# Patient Record
Sex: Male | Born: 1970 | Race: Black or African American | Hispanic: No | Marital: Married | Smoking: Never smoker
Health system: Southern US, Community
[De-identification: ages and names within clinical notes are randomized; demographics above are authoritative.]

## PROBLEM LIST (undated history)

## (undated) DIAGNOSIS — T7840XA Allergy, unspecified, initial encounter: Secondary | ICD-10-CM

## (undated) HISTORY — DX: Allergy, unspecified, initial encounter: T78.40XA

## (undated) HISTORY — PX: BACK SURGERY: SHX140

---

## 2004-10-31 ENCOUNTER — Emergency Department (HOSPITAL_COMMUNITY): Admission: EM | Admit: 2004-10-31 | Discharge: 2004-10-31 | Payer: Self-pay | Admitting: Family Medicine

## 2016-11-10 ENCOUNTER — Ambulatory Visit
Admission: EM | Admit: 2016-11-10 | Discharge: 2016-11-10 | Disposition: A | Discharge status: Home / Self Care | Payer: BC Managed Care – PPO | Attending: Family Medicine | Admitting: Family Medicine

## 2016-11-10 ENCOUNTER — Encounter: Payer: Self-pay | Admitting: *Deleted

## 2016-11-10 DIAGNOSIS — B9789 Other viral agents as the cause of diseases classified elsewhere: Secondary | ICD-10-CM

## 2016-11-10 DIAGNOSIS — J069 Acute upper respiratory infection, unspecified: Secondary | ICD-10-CM

## 2016-11-10 NOTE — ED Triage Notes (Signed)
Patient started having symptoms of body aches, fever, and nasal congestion 3 days ago.

## 2016-11-10 NOTE — ED Provider Notes (Signed)
[facility]    CSN: [id number] Arrival date & time: 11/10/16  1001     History   Chief Complaint Chief Complaint  Patient presents with  . Generalized Body Aches  . Fever  . Nasal Congestion    HPI Darren Rollins is a 46 y.o. male.    Fever  Associated symptoms: congestion, cough, myalgias and rhinorrhea   URI  Presenting symptoms: congestion, cough, fever and rhinorrhea   Severity:  Moderate Onset quality:  Sudden Duration:  3 days Timing:  Constant Progression:  Unchanged Chronicity:  New Relieved by:  None tried Associated symptoms: myalgias   Risk factors: not elderly, no chronic cardiac disease, no chronic kidney disease, no chronic respiratory disease, no diabetes mellitus, no immunosuppression, no recent illness, no recent travel and no sick contacts     History reviewed. No pertinent past medical history.  There are no active problems to display for this patient.   Past Surgical History:  Procedure Laterality Date  . BACK SURGERY         Home Medications    Prior to Admission medications   Not on File    Family History History reviewed. No pertinent family history.  Social History Social History  Substance Use Topics  . Smoking status: Never Smoker  . Smokeless tobacco: Never Used  . Alcohol use No     Allergies   Penicillins   Review of Systems Review of Systems  Constitutional: Positive for fever.  HENT: Positive for congestion and rhinorrhea.   Respiratory: Positive for cough.   Musculoskeletal: Positive for myalgias.     Physical Exam Triage Vital Signs ED Triage Vitals  Enc Vitals Group     BP 11/10/16 1050 129/69     Pulse Rate 11/10/16 1050 79     Resp 11/10/16 1050 16     Temp 11/10/16 1050 98.2 F (36.8 C)     Temp Source 11/10/16 1050 Oral     SpO2 11/10/16 1050 100 %     Weight 11/10/16 1051 192 lb (87.1 kg)     Height 11/10/16 1051 5\' 7"  (1.702 m)     Head Circumference --      Peak Flow --        Pain Score 11/10/16 1054 0     Pain Loc --      Pain Edu? --      Excl. in GC? --    No data found.   Updated Vital Signs BP 129/69 (BP Location: Left Arm)   Pulse 79   Temp 98.2 F (36.8 C) (Oral)   Resp 16   Ht 5\' 7"  (1.702 m)   Wt 192 lb (87.1 kg)   SpO2 100%   BMI 30.07 kg/m   Visual Acuity Right Eye Distance:   Left Eye Distance:   Bilateral Distance:    Right Eye Near:   Left Eye Near:    Bilateral Near:     Physical Exam  Constitutional: He appears well-developed and well-nourished. No distress.  HENT:  Head: Normocephalic and atraumatic.  Right Ear: Tympanic membrane, external ear and ear canal normal.  Left Ear: Tympanic membrane, external ear and ear canal normal.  Nose: Nose normal.  Mouth/Throat: Uvula is midline, oropharynx is clear and moist and mucous membranes are normal. No oropharyngeal exudate or tonsillar abscesses.  Eyes: Conjunctivae and EOM are normal. Pupils are equal, round, and reactive to light. Right eye exhibits no discharge. Left eye exhibits no discharge. No  scleral icterus.  Neck: Normal range of motion. Neck supple. No tracheal deviation present. No thyromegaly present.  Cardiovascular: Normal rate, regular rhythm and normal heart sounds.   Pulmonary/Chest: Effort normal and breath sounds normal. No stridor. No respiratory distress. He has no wheezes. He has no rales. He exhibits no tenderness.  Lymphadenopathy:    He has no cervical adenopathy.  Neurological: He is alert.  Skin: Skin is warm and dry. No rash noted. He is not diaphoretic.  Nursing note and vitals reviewed.    UC Treatments / Results  Labs (all labs ordered are listed, but only abnormal results are displayed) Labs Reviewed - No data to display  EKG  EKG Interpretation None       Radiology No results found.  Procedures Procedures (including critical care time)  Medications Ordered in UC Medications - No data to display   Initial Impression /  Assessment and Plan / UC Course  I have reviewed the triage vital signs and the nursing notes.  Pertinent labs & imaging results that were available during my care of the patient were reviewed by me and considered in my medical decision making (see chart for details).       Final Clinical Impressions(s) / UC Diagnoses   Final diagnoses:  Viral URI with cough    New Prescriptions There are no discharge medications for this patient.  1. diagnosis reviewed with patient 2. Recommend supportive treatment with rest, fluids, otc meds 3. Follow-up prn if symptoms worsen or don't improve   Payton Mccallumrlando Melora Menon, MD 11/10/16 1202

## 2016-11-19 ENCOUNTER — Ambulatory Visit (INDEPENDENT_AMBULATORY_CARE_PROVIDER_SITE_OTHER): Payer: BC Managed Care – PPO | Admitting: Family Medicine

## 2016-11-19 ENCOUNTER — Encounter: Payer: Self-pay | Admitting: Family Medicine

## 2016-11-19 VITALS — BP 118/70 | HR 90 | Temp 97.9°F | Resp 16 | Ht 67.5 in | Wt 193.0 lb

## 2016-11-19 DIAGNOSIS — Z23 Encounter for immunization: Secondary | ICD-10-CM | POA: Diagnosis not present

## 2016-11-19 DIAGNOSIS — Z0000 Encounter for general adult medical examination without abnormal findings: Secondary | ICD-10-CM | POA: Diagnosis not present

## 2016-11-19 DIAGNOSIS — E663 Overweight: Secondary | ICD-10-CM | POA: Diagnosis not present

## 2016-11-19 LAB — COMPREHENSIVE METABOLIC PANEL
ALT: 33 U/L (ref 9–46)
AST: 22 U/L (ref 10–40)
Albumin: 4.2 g/dL (ref 3.6–5.1)
Alkaline Phosphatase: 49 U/L (ref 40–115)
BILIRUBIN TOTAL: 0.4 mg/dL (ref 0.2–1.2)
BUN: 14 mg/dL (ref 7–25)
CALCIUM: 9 mg/dL (ref 8.6–10.3)
CO2: 26 mmol/L (ref 20–31)
Chloride: 104 mmol/L (ref 98–110)
Creat: 1.08 mg/dL (ref 0.60–1.35)
Glucose, Bld: 82 mg/dL (ref 70–99)
Potassium: 4.2 mmol/L (ref 3.5–5.3)
SODIUM: 138 mmol/L (ref 135–146)
Total Protein: 7.3 g/dL (ref 6.1–8.1)

## 2016-11-19 LAB — LIPID PANEL
CHOL/HDL RATIO: 4.6 ratio (ref <5.0)
Cholesterol: 161 mg/dL (ref <200)
HDL: 35 mg/dL — AB (ref >40)
LDL Cholesterol: 83 mg/dL (ref <100)
Triglycerides: 213 mg/dL — ABNORMAL HIGH (ref <150)
VLDL: 43 mg/dL — ABNORMAL HIGH (ref <30)

## 2016-11-19 LAB — CBC WITH DIFFERENTIAL/PLATELET
BASOS PCT: 2 %
Basophils Absolute: 94 cells/uL (ref 0–200)
EOS PCT: 2 %
Eosinophils Absolute: 94 cells/uL (ref 15–500)
HEMATOCRIT: 47.3 % (ref 38.5–50.0)
Hemoglobin: 15.9 g/dL (ref 13.0–17.0)
LYMPHS PCT: 46 %
Lymphs Abs: 2162 cells/uL (ref 850–3900)
MCH: 30.5 pg (ref 27.0–33.0)
MCHC: 33.6 g/dL (ref 32.0–36.0)
MCV: 90.6 fL (ref 80.0–100.0)
MPV: 10.6 fL (ref 7.5–12.5)
Monocytes Absolute: 423 cells/uL (ref 200–950)
Monocytes Relative: 9 %
NEUTROS PCT: 41 %
Neutro Abs: 1927 cells/uL (ref 1500–7800)
PLATELETS: 256 10*3/uL (ref 140–400)
RBC: 5.22 MIL/uL (ref 4.20–5.80)
RDW: 13.8 % (ref 11.0–15.0)
WBC: 4.7 10*3/uL (ref 3.8–10.8)

## 2016-11-19 LAB — TSH: TSH: 1.96 mIU/L (ref 0.40–4.50)

## 2016-11-19 NOTE — Addendum Note (Signed)
Addended by: Phineas SemenJOHNSON, Estil Vallee A on: 11/19/2016 04:20 PM   Modules accepted: Orders

## 2016-11-19 NOTE — Patient Instructions (Signed)
F/U 1 year or as needed I recommend eye visit once a year I recommend dental visit every 6 months Goal is to  Exercise 30 minutes 5 days a week We will call with lab results  Work on your weight- goal 180lbs Tetanus shot given
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
## 2018-07-10 ENCOUNTER — Ambulatory Visit: Payer: BC Managed Care – PPO | Admitting: Family Medicine

## 2018-07-10 ENCOUNTER — Encounter: Payer: Self-pay | Admitting: Family Medicine

## 2018-07-10 VITALS — BP 120/78 | HR 80 | Temp 97.7°F | Resp 16 | Ht 66.5 in | Wt 190.8 lb

## 2018-07-10 DIAGNOSIS — J069 Acute upper respiratory infection, unspecified: Secondary | ICD-10-CM

## 2018-07-10 MED ORDER — PREDNISONE 20 MG PO TABS: ORAL_TABLET | ORAL | 0 refills | Status: DC

## 2018-07-10 MED ORDER — BENZONATATE 100 MG PO CAPS: 100.0000 mg | ORAL_CAPSULE | Freq: Three times a day (TID) | ORAL | 0 refills | Status: DC | PRN

## 2018-07-10 NOTE — Patient Instructions (Signed)
Start your allergy meds again, and I would do a flonase too  Start taking mucinex and drink a lot of water  I have sent through steroids and a cough medicine.   Viral Respiratory Infection A viral respiratory infection is an illness that affects parts of the body used for breathing, like the lungs, nose, and throat. It is caused by a germ called a virus. Some examples of this kind of infection are:  A cold.  The flu (influenza).  A respiratory syncytial virus (RSV) infection.  How do I know if I have this infection? Most of the time this infection causes:  A stuffy or runny nose.  Yellow or green fluid in the nose.  A cough.  Sneezing.  Tiredness (fatigue).  Achy muscles.  A sore throat.  Sweating or chills.  A fever.  A headache.  How is this infection treated? If the flu is diagnosed early, it may be treated with an antiviral medicine. This medicine shortens the length of time a person has symptoms. Symptoms may be treated with over-the-counter and prescription medicines, such as:  Expectorants. These make it easier to cough up mucus.  Decongestant nasal sprays.  Doctors do not prescribe antibiotic medicines for viral infections. They do not work with this kind of infection. How do I know if I should stay home? To keep others from getting sick, stay home if you have:  A fever.  A lasting cough.  A sore throat.  A runny nose.  Sneezing.  Muscles aches.  Headaches.  Tiredness.  Weakness.  Chills.  Sweating.  An upset stomach (nausea).  Follow these instructions at home:  Rest as much as possible.  Take over-the-counter and prescription medicines only as told by your doctor.  Drink enough fluid to keep your pee (urine) clear or pale yellow.  Gargle with salt water. Do this 3-4 times per day or as needed. To make a salt-water mixture, dissolve -1 tsp of salt in 1 cup of warm water. Make sure the salt dissolves all the way.  Use nose  drops made from salt water. This helps with stuffiness (congestion). It also helps soften the skin around your nose.  Do not drink alcohol.  Do not use tobacco products, including cigarettes, chewing tobacco, and e-cigarettes. If you need help quitting, ask your doctor. Get help if:  Your symptoms last for 10 days or longer.  Your symptoms get worse over time.  You have a fever.  You have very bad pain in your face or forehead.  Parts of your jaw or neck become very swollen. Get help right away if:  You feel pain or pressure in your chest.  You have shortness of breath.  You faint or feel like you will faint.  You keep throwing up (vomiting).  You feel confused. This information is not intended to replace advice given to you by your health care provider. Make sure you discuss any questions you have with your health care provider. Document Released: 09/12/2008 Document Revised: 03/07/2016 Document Reviewed: 03/08/2015 Elsevier Interactive Patient Education  2018 [employer].

## 2018-07-10 NOTE — Progress Notes (Signed)
Patient ID: Darren Rollins, male    DOB: 1971/02/01, 47 y.o.   MRN: [id number]  PCP: Salley Scarlet, MD  Chief Complaint  Patient presents with  . Chest Pain  . Cough    when coughing arm pain     Subjective:   Darren Rollins is a 47 y.o. male, presents to clinic with CC of Cough  This is a new problem. The current episode started 1 to 4 weeks ago. The problem has been unchanged. The problem occurs every few hours. The cough is non-productive. Pertinent negatives include no chest pain, chills, fever, headaches, hemoptysis, nasal congestion, postnasal drip, rash, rhinorrhea, sore throat, shortness of breath, sweats, weight loss or wheezing. Nothing aggravates the symptoms. He has tried nothing for the symptoms. The treatment provided no relief. His past medical history is significant for environmental allergies. There is no history of asthma, bronchiectasis, bronchitis, COPD, emphysema or pneumonia.  URI   Associated symptoms include coughing. Pertinent negatives include no chest pain, headaches, rash, rhinorrhea, sore throat or wheezing.  Has some hx of sinus issues, usually uses allegra or claritin in spring or a little bit before fall No sick contacts He denies inspiratory CP, no exertional CP or dyspnea.    There are no active problems to display for this patient.    Prior to Admission medications   Not on File     Allergies  Allergen Reactions  . Beef-Derived Products   . Penicillins Other (See Comments)    Patient is unsure of reaction.     Family History  Problem Relation Age of Onset  . Arthritis Mother   . Cancer Paternal Aunt   . Early death Paternal Aunt   . Cancer Paternal Uncle   . Early death Paternal Uncle   . Hypertension Maternal Grandmother      Social History   Socioeconomic History  . Marital status: Married    Spouse name: Not on file  . Number of children: Not on file  . Years of education: Not on file  . Highest education level: Not  on file  Occupational History  . Not on file  Social Needs  . Financial resource strain: Not on file  . Food insecurity:    Worry: Not on file    Inability: Not on file  . Transportation needs:    Medical: Not on file    Non-medical: Not on file  Tobacco Use  . Smoking status: Never Smoker  . Smokeless tobacco: Never Used  Substance and Sexual Activity  . Alcohol use: No  . Drug use: No  . Sexual activity: Yes  Lifestyle  . Physical activity:    Days per week: Not on file    Minutes per session: Not on file  . Stress: Not on file  Relationships  . Social connections:    Talks on phone: Not on file    Gets together: Not on file    Attends religious service: Not on file    Active member of club or organization: Not on file    Attends meetings of clubs or organizations: Not on file    Relationship status: Not on file  . Intimate partner violence:    Fear of current or ex partner: Not on file    Emotionally abused: Not on file    Physically abused: Not on file    Forced sexual activity: Not on file  Other Topics Concern  . Not on file  Social  History Narrative  . Not on file     Review of Systems  Constitutional: Negative.  Negative for chills, fever and weight loss.  HENT: Negative.  Negative for postnasal drip, rhinorrhea and sore throat.   Eyes: Negative.   Respiratory: Positive for cough. Negative for hemoptysis, shortness of breath and wheezing.   Cardiovascular: Negative.  Negative for chest pain.  Gastrointestinal: Negative.   Endocrine: Negative.   Genitourinary: Negative.   Musculoskeletal: Negative.   Skin: Negative.  Negative for rash.  Allergic/Immunologic: Positive for environmental allergies.  Neurological: Negative.  Negative for headaches.  Hematological: Negative.   Psychiatric/Behavioral: Negative.   All other systems reviewed and are negative.      Objective:    Vitals:   07/10/18 1025  BP: 120/78  Pulse: 80  Resp: 16  Temp: 97.7 F  (36.5 C)  TempSrc: Oral  SpO2: 97%  Weight: 190 lb 12.8 oz (86.5 kg)  Height: 5' 6.5" (1.689 m)      Physical Exam  Constitutional: He is oriented to person, place, and time. He appears well-developed and well-nourished.  Non-toxic appearance. He does not appear ill. No distress.  well appearing  HENT:  Head: Normocephalic and atraumatic.  Right Ear: Tympanic membrane, external ear and ear canal normal.  Left Ear: Tympanic membrane, external ear and ear canal normal.  Nose: No mucosal edema or rhinorrhea. Right sinus exhibits no maxillary sinus tenderness and no frontal sinus tenderness. Left sinus exhibits no maxillary sinus tenderness and no frontal sinus tenderness.  Mouth/Throat: Uvula is midline. No trismus in the jaw. No uvula swelling. No oropharyngeal exudate, posterior oropharyngeal edema or posterior oropharyngeal erythema.  Mild OP erythema, no edema no exudate, uvula midline MMM Nasal mucosa erythematous Enlarged boggy pale b/l turbinates  Eyes: Pupils are equal, round, and reactive to light. Conjunctivae, EOM and lids are normal. Right eye exhibits no discharge. Left eye exhibits no discharge. No scleral icterus.  Neck: Trachea normal, normal range of motion and phonation normal. Neck supple. No tracheal deviation present.  Cardiovascular: Normal rate, regular rhythm, normal heart sounds, intact distal pulses and normal pulses. PMI is not displaced. Exam reveals no gallop and no friction rub.  No murmur heard. Pulses:      Radial pulses are 2+ on the right side, and 2+ on the left side.       Posterior tibial pulses are 2+ on the right side, and 2+ on the left side.  Pulmonary/Chest: Effort normal and breath sounds normal. No accessory muscle usage or stridor. No tachypnea. No respiratory distress. He has no decreased breath sounds. He has no wheezes. He has no rhonchi. He has no rales. He exhibits no tenderness.  Abdominal: Soft. Normal appearance and bowel sounds are  normal. He exhibits no distension. There is no tenderness. There is no rebound and no guarding.  Musculoskeletal: Normal range of motion. He exhibits no edema.       Right lower leg: Normal. He exhibits no edema.       Left lower leg: Normal. He exhibits no edema.  Lymphadenopathy:    He has no cervical adenopathy.  Neurological: He is alert and oriented to person, place, and time. He exhibits normal muscle tone. Coordination and gait normal.  Skin: Skin is warm, dry and intact. Capillary refill takes less than 2 seconds. No rash noted. He is not diaphoretic. No cyanosis. No pallor. Nails show no clubbing.  Psychiatric: He has a normal mood and affect. His speech is normal  and behavior is normal. Judgment and thought content normal.  Nursing note and vitals reviewed.         Assessment & Plan:      ICD-10-CM   1. Upper respiratory tract infection, unspecified type J06.9    47 y/o male, well appearing, stable vital signs, cardiac and pulmonary exam unremarkable, HEENT + for nose and throat erythema and some signs of allergic rhinitis, suspect URI.  Tx with mucinex, antihistamine, flonase, OTC cough meds, if not improvement can start steroid burst to treat some possible early bronchitis.   Push fluids, use tylenol/ibuprofen PRN and other supportive and sx measures.  Returns as needed if worsening, new fever, SOB, wheeze etc.   No concern for CAP of flu, afebrile, well appearing, lungs clear.      Danelle Berry, PA-C 07/10/18 10:49 AM

## 2018-09-17 ENCOUNTER — Ambulatory Visit (INDEPENDENT_AMBULATORY_CARE_PROVIDER_SITE_OTHER): Payer: BC Managed Care – PPO | Admitting: Family Medicine

## 2018-09-17 ENCOUNTER — Encounter: Payer: Self-pay | Admitting: Family Medicine

## 2018-09-17 ENCOUNTER — Ambulatory Visit: Admission: RE | Admit: 2018-09-17 | Payer: BC Managed Care – PPO | Source: Ambulatory Visit

## 2018-09-17 ENCOUNTER — Ambulatory Visit
Admission: RE | Admit: 2018-09-17 | Discharge: 2018-09-17 | Disposition: A | Discharge status: Home / Self Care | Payer: BC Managed Care – PPO | Source: Ambulatory Visit | Attending: Family Medicine | Admitting: Family Medicine

## 2018-09-17 VITALS — BP 140/90 | HR 90 | Temp 98.4°F | Resp 16 | Ht 66.5 in | Wt 200.5 lb

## 2018-09-17 DIAGNOSIS — J181 Lobar pneumonia, unspecified organism: Secondary | ICD-10-CM | POA: Diagnosis not present

## 2018-09-17 DIAGNOSIS — R079 Chest pain, unspecified: Secondary | ICD-10-CM

## 2018-09-17 DIAGNOSIS — J209 Acute bronchitis, unspecified: Secondary | ICD-10-CM

## 2018-09-17 DIAGNOSIS — J189 Pneumonia, unspecified organism: Secondary | ICD-10-CM

## 2018-09-17 MED ORDER — IPRATROPIUM-ALBUTEROL 0.5-2.5 (3) MG/3ML IN SOLN
3.0000 mL | Freq: Once | RESPIRATORY_TRACT | Status: AC
  Administered 2018-09-17: 3 mL via RESPIRATORY_TRACT

## 2018-09-17 MED ORDER — ALBUTEROL SULFATE HFA 108 (90 BASE) MCG/ACT IN AERS: 2.0000 | INHALATION_SPRAY | RESPIRATORY_TRACT | 0 refills | Status: DC | PRN

## 2018-09-17 MED ORDER — PREDNISONE 20 MG PO TABS: 40.0000 mg | ORAL_TABLET | Freq: Every day | ORAL | 0 refills | Status: AC

## 2018-09-17 MED ORDER — GUAIFENESIN ER 600 MG PO TB12: 600.0000 mg | ORAL_TABLET | Freq: Two times a day (BID) | ORAL | 0 refills | Status: AC

## 2018-09-17 MED ORDER — AZITHROMYCIN 250 MG PO TABS: ORAL_TABLET | ORAL | 0 refills | Status: DC

## 2018-09-17 NOTE — Progress Notes (Signed)
Patient ID: Darren Rollins, male    DOB: January 24, 1971, 47 y.o.   MRN: [id number]  PCP: Salley Scarlet, MD  Chief Complaint  Patient presents with  . Chest Pain    Subjective:   Darren Rollins is a 47 y.o. male, presents to clinic with CC of CP x 1 month Patient presents with roughly 1 month of slowly worsening hour-long episodes of substernal and left lower anterior chest pain which she describes as sharp and a "squeeze" that is nonradiating.  Occurs intermittently when at rest, seems to be more frequent to become painful when he is laying down at night.  He has not noticed any aggravating or alleviating factors, no change with body positions, it is not reproducible with palpation or movement, does not recur with any strenuous activity.  He has associated very mild productive cough phlegm is tan and he has had some very mild exertional shortness of breath.  No smoking history, is taking Claritin over-the-counter cough medicines.  He has come in today to get evaluated because his symptoms have seemed to become more frequent with the pain occurring 1-2 times a day, when the pain comes to a central line left anterior lower chest it does last for a few hours.  His family was concerned that he should have his heart checked.  Chest Pain   This is a new problem. Episode onset: one month ago. The onset quality is gradual. The problem occurs 2 to 4 times per day. The problem has been gradually worsening. Pain location: central anterior chest to left anterior chest near PMI. The pain is at a severity of 5/10. The quality of the pain is described as sharp and squeezing. The pain does not radiate. Associated symptoms include a cough, shortness of breath and sputum production (tan). Pertinent negatives include no abdominal pain, back pain, claudication, diaphoresis, dizziness, exertional chest pressure, fever, headaches, hemoptysis, irregular heartbeat, leg pain, lower extremity edema, malaise/fatigue, nausea,  near-syncope, numbness, orthopnea, palpitations, PND, syncope, vomiting or weakness. The cough has no precipitants. The cough is productive. Darren Rollins becomes gray when coughing. Nothing relieves the cough. The cough is worsened by a supine position. The pain is aggravated by nothing. He has tried nothing for the symptoms. The treatment provided no relief. Risk factors include sedentary lifestyle and male gender.  Pertinent negatives for past medical history include no anxiety/panic attacks, no arrhythmia, no CAD, no COPD, no CHF, no diabetes, no DVT, no hyperlipidemia, no hypertension, no MI, no recent injury, no sleep apnea, no spontaneous pneumothorax, no stimulant use and no thyroid problem.  Pertinent negatives for family medical history include: no CAD, no diabetes, no heart disease, no hyperlipidemia, no hypertension and no early MI.      There are no active problems to display for this patient.   Taking Claritin   Allergies  Allergen Reactions  . Beef-Derived Products   . Penicillins Other (See Comments)    Patient is unsure of reaction.     Family History  Problem Relation Age of Onset  . Arthritis Mother   . Cancer Paternal Aunt   . Early death Paternal Aunt   . Cancer Paternal Uncle   . Early death Paternal Uncle   . Hypertension Maternal Grandmother      Social History   Socioeconomic History  . Marital status: Married    Spouse name: Not on file  . Number of children: Not on file  . Years of education: Not on file  .  Highest education level: Not on file  Occupational History  . Not on file  Social Needs  . Financial resource strain: Not on file  . Food insecurity:    Worry: Not on file    Inability: Not on file  . Transportation needs:    Medical: Not on file    Non-medical: Not on file  Tobacco Use  . Smoking status: Never Smoker  . Smokeless tobacco: Never Used  Substance and Sexual Activity  . Alcohol use: No  . Drug use: No  . Sexual activity: Yes    Lifestyle  . Physical activity:    Days per week: Not on file    Minutes per session: Not on file  . Stress: Not on file  Relationships  . Social connections:    Talks on phone: Not on file    Gets together: Not on file    Attends religious service: Not on file    Active member of club or organization: Not on file    Attends meetings of clubs or organizations: Not on file    Relationship status: Not on file  . Intimate partner violence:    Fear of current or ex partner: Not on file    Emotionally abused: Not on file    Physically abused: Not on file    Forced sexual activity: Not on file  Other Topics Concern  . Not on file  Social History Narrative  . Not on file     Review of Systems  Constitutional: Negative.  Negative for activity change, appetite change, chills, diaphoresis, fatigue, fever, malaise/fatigue and unexpected weight change.  HENT: Negative.   Eyes: Negative.   Respiratory: Positive for cough, sputum production (tan) and shortness of breath. Negative for apnea, hemoptysis, choking, wheezing and stridor.   Cardiovascular: Positive for chest pain. Negative for palpitations, orthopnea, claudication, leg swelling, syncope, PND and near-syncope.  Gastrointestinal: Negative.  Negative for abdominal pain, nausea and vomiting.  Endocrine: Negative.   Genitourinary: Negative.   Musculoskeletal: Negative.  Negative for back pain.  Skin: Negative.  Negative for color change, pallor, rash and wound.  Allergic/Immunologic: Negative.   Neurological: Negative.  Negative for dizziness, syncope, weakness, numbness and headaches.  Hematological: Negative.   Psychiatric/Behavioral: Negative.   All other systems reviewed and are negative.      Objective:    Vitals:   09/17/18 0805  BP: 140/90  Pulse: 90  Resp: 16  Temp: 98.4 F (36.9 C)  TempSrc: Oral  SpO2: 99%  Weight: 200 lb 8 oz (90.9 kg)  Height: 5' 6.5" (1.689 m)      Physical Exam  Constitutional: He  is oriented to person, place, and time. He appears well-developed and well-nourished.  Non-toxic appearance. He does not appear ill. No distress.  HENT:  Head: Normocephalic and atraumatic.  Right Ear: Tympanic membrane, external ear and ear canal normal.  Left Ear: Tympanic membrane, external ear and ear canal normal.  Nose: Nose normal. No mucosal edema or rhinorrhea. Right sinus exhibits no maxillary sinus tenderness and no frontal sinus tenderness. Left sinus exhibits no maxillary sinus tenderness and no frontal sinus tenderness.  Mouth/Throat: Uvula is midline and oropharynx is clear and moist. No trismus in the jaw. No uvula swelling. No oropharyngeal exudate, posterior oropharyngeal edema or posterior oropharyngeal erythema.  Eyes: Pupils are equal, round, and reactive to light. Conjunctivae, EOM and lids are normal.  Neck: Trachea normal, normal range of motion and phonation normal. Neck supple. No JVD present.  No tracheal deviation present. No thyromegaly present.  Cardiovascular: Regular rhythm and normal pulses. PMI is not displaced. Exam reveals no gallop, no distant heart sounds and no friction rub.  Murmur (faint possible) heard.  Diastolic murmur is present. Pulses:      Radial pulses are 2+ on the right side, and 2+ on the left side.       Posterior tibial pulses are 2+ on the right side, and 2+ on the left side.  No LE edema  Pulmonary/Chest: Effort normal. No accessory muscle usage. No tachypnea. No respiratory distress. He has decreased breath sounds in the right lower field and the left lower field. He has no wheezes. He has no rhonchi. He has no rales.  Abdominal: Soft. Normal appearance and bowel sounds are normal. He exhibits no distension. There is no tenderness. There is no rebound and no guarding.  Musculoskeletal: Normal range of motion. He exhibits no edema.       Right lower leg: Normal. He exhibits no edema.       Left lower leg: Normal. He exhibits no edema.    Lymphadenopathy:    He has no cervical adenopathy.  Neurological: He is alert and oriented to person, place, and time. Gait normal.  Skin: Skin is warm, dry and intact. Capillary refill takes less than 2 seconds. No rash noted. He is not diaphoretic. No cyanosis. No pallor. Nails show no clubbing.  Psychiatric: His speech is normal and behavior is normal. His mood appears anxious. He is not agitated.  Nursing note and vitals reviewed.   EKG:  NSR, no ST elevation or depression, non-specific Twave abnormalities, borderline LVH, HR 76, normal axis      Assessment & Plan:      ICD-10-CM   1. Acute bronchitis, unspecified organism J20.9 predniSONE (DELTASONE) 20 MG tablet    albuterol (PROVENTIL HFA;VENTOLIN HFA) 108 (90 Base) MCG/ACT inhaler    guaiFENesin (MUCINEX) 600 MG 12 hr tablet  2. Chest pain, unspecified type R07.9 EKG 12-Lead    COMPLETE METABOLIC PANEL WITH GFR    CBC with Differential/Platelet    Brain natriuretic peptide    TSH    Magnesium    DG Chest 2 View    Lipid Panel    ipratropium-albuterol (DUONEB) 0.5-2.5 (3) MG/3ML nebulizer solution 3 mL  3. Community acquired pneumonia of left lower lobe of lung (HCC) J18.1 predniSONE (DELTASONE) 20 MG tablet    albuterol (PROVENTIL HFA;VENTOLIN HFA) 108 (90 Base) MCG/ACT inhaler    guaiFENesin (MUCINEX) 600 MG 12 hr tablet    azithromycin (ZITHROMAX) 250 MG tablet    duoneb given in clinic - pt was reevaluated afterwards.  BS did improve to bilateral lower lobes, pt noted improvement in chest sx.  He had some temporary decreased SpO2 which I personally monitored while evaluating him, recheck with a second pulse ox, HR 90 SpO2 became steady at 95% after a few minutes in exam room.    tx for bronchitis - pt going to get CXR  EKG unremarkable  BP elevated here today - he has been taking some OTC cough meds, monitoring at home he says even with CP episodes it is usually 120/70, so I believe some of his increased BP today is  secondary to meds and some nervousness about his sx.  He was well appearing, I doubt ACS   Danelle Berry, PA-C 09/17/18 9:08 AM    7:11 PM   CXR results received and reviewed: I will call pt and  tx plan will be the same.  Chest x-ray shows low lung volumes with questionable air bronchogram and airspace opacity in the left lung base significant for potential developing infection versus atelectasis.  Patient clinically did have some difficulty expanding lungs fully due to suspected bronchospasm and coughing because of his prolonged symptoms, the diminished breath sounds and his pain I did cover with a Z-Pak which would cover for community-acquired pneumonia.  Findings of CXR and plan were called the patient this evening - follow up in 2 weeks if not much better.  Follow up sooner if any worse.

## 2018-09-18 ENCOUNTER — Encounter: Payer: Self-pay | Admitting: Family Medicine

## 2018-09-18 LAB — CBC WITH DIFFERENTIAL/PLATELET
BASOS PCT: 1 %
Basophils Absolute: 49 cells/uL (ref 0–200)
EOS ABS: 118 {cells}/uL (ref 15–500)
Eosinophils Relative: 2.4 %
HCT: 48.2 % (ref 38.5–50.0)
Hemoglobin: 16.5 g/dL (ref 13.2–17.1)
Lymphs Abs: 2073 cells/uL (ref 850–3900)
MCH: 30.8 pg (ref 27.0–33.0)
MCHC: 34.2 g/dL (ref 32.0–36.0)
MCV: 89.9 fL (ref 80.0–100.0)
MONOS PCT: 8.7 %
MPV: 12.2 fL (ref 7.5–12.5)
NEUTROS PCT: 45.6 %
Neutro Abs: 2234 cells/uL (ref 1500–7800)
PLATELETS: 196 10*3/uL (ref 140–400)
RBC: 5.36 10*6/uL (ref 4.20–5.80)
RDW: 12.6 % (ref 11.0–15.0)
TOTAL LYMPHOCYTE: 42.3 %
WBC mixed population: 426 cells/uL (ref 200–950)
WBC: 4.9 10*3/uL (ref 3.8–10.8)

## 2018-09-18 LAB — COMPLETE METABOLIC PANEL WITH GFR
AG RATIO: 1.4 (calc) (ref 1.0–2.5)
ALKALINE PHOSPHATASE (APISO): 55 U/L (ref 40–115)
ALT: 30 U/L (ref 9–46)
AST: 19 U/L (ref 10–40)
Albumin: 4 g/dL (ref 3.6–5.1)
BILIRUBIN TOTAL: 0.4 mg/dL (ref 0.2–1.2)
BUN: 16 mg/dL (ref 7–25)
CO2: 26 mmol/L (ref 20–32)
Calcium: 9.1 mg/dL (ref 8.6–10.3)
Chloride: 105 mmol/L (ref 98–110)
Creat: 1.15 mg/dL (ref 0.60–1.35)
GFR, Est African American: 88 mL/min/{1.73_m2} (ref >=60)
GFR, Est Non African American: 76 mL/min/{1.73_m2} (ref >=60)
GLOBULIN: 2.9 g/dL (ref 1.9–3.7)
Glucose, Bld: 84 mg/dL (ref 65–99)
POTASSIUM: 4.3 mmol/L (ref 3.5–5.3)
Sodium: 139 mmol/L (ref 135–146)
Total Protein: 6.9 g/dL (ref 6.1–8.1)

## 2018-09-18 LAB — MAGNESIUM: Magnesium: 1.8 mg/dL (ref 1.5–2.5)

## 2018-09-18 LAB — LIPID PANEL
Cholesterol: 174 mg/dL (ref <200)
HDL: 38 mg/dL — ABNORMAL LOW (ref >40)
LDL Cholesterol (Calc): 111 mg/dL (calc) — ABNORMAL HIGH
Non-HDL Cholesterol (Calc): 136 mg/dL (calc) — ABNORMAL HIGH (ref <130)
Total CHOL/HDL Ratio: 4.6 (calc) (ref <5.0)
Triglycerides: 134 mg/dL (ref <150)

## 2018-09-18 LAB — TSH: TSH: 1.45 mIU/L (ref 0.40–4.50)

## 2018-09-18 LAB — BRAIN NATRIURETIC PEPTIDE: BRAIN NATRIURETIC PEPTIDE: 21 pg/mL (ref <100)

## 2018-10-01 ENCOUNTER — Ambulatory Visit: Payer: BC Managed Care – PPO | Admitting: Family Medicine

## 2018-10-01 ENCOUNTER — Encounter: Payer: Self-pay | Admitting: Family Medicine

## 2018-10-01 VITALS — BP 130/72 | HR 81 | Temp 98.2°F | Resp 15 | Ht 66.5 in | Wt 198.1 lb

## 2018-10-01 DIAGNOSIS — R011 Cardiac murmur, unspecified: Secondary | ICD-10-CM

## 2018-10-01 DIAGNOSIS — J0140 Acute pansinusitis, unspecified: Secondary | ICD-10-CM

## 2018-10-01 MED ORDER — DOXYCYCLINE HYCLATE 100 MG PO TABS: 100.0000 mg | ORAL_TABLET | Freq: Two times a day (BID) | ORAL | 0 refills | Status: AC

## 2018-10-01 NOTE — Progress Notes (Signed)
Patient ID: Darren Rollins, male    DOB: 11-Apr-1971, 47 y.o.   MRN: [id number]  PCP: Salley Scarlet, MD  Chief Complaint  Patient presents with  . Chest Pain    Patient in today for a 2 week follow up on chest pain. States has completely resolved     Subjective:   Darren Rollins is a 47 y.o. male, presents to clinic with CC of follow-up on chest pain, was seen roughly 2 weeks ago, diagnosed with likely acute bronchitis causing his pain, was treated and is here for recheck.  He does have complete resolution of pain in his chest is no longer having any pain come and go.  He denies any shortness of breath or wheeze.  He does have some new nasal congestion and postnasal drip, family members are still intermittently dealing with URI illness.   Patient denies any wheeze, shortness of breath, chest pain at rest, with deep inspiration or with exertion.  He denies any palpitations, near-syncope, orthopnea, PND, lower extremity edema.  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 Reports some nasal congestion and drainage, mild pressure and headache has been ongoing for last week.  He does not feel that he has any postnasal drip causing any significant cough he denies any chest congestion, no sore throat, fever, chills, sweats, rash.   There are no active problems to display for this patient.    Prior to Admission medications   Medication Sig Start Date End Date Taking? Authorizing Provider  albuterol (PROVENTIL HFA;VENTOLIN HFA) 108 (90 Base) MCG/ACT inhaler Inhale 2 puffs into the lungs every 4 (four) hours as needed for wheezing or shortness of breath. Patient not taking: Reported on 10/01/2018 09/17/18   Danelle Berry, PA-C  azithromycin (ZITHROMAX) 250 MG tablet Take 2 tabs (500 mg) PO q d for 1d, then take 1 tab (250 mg) PO q d for day 2-5 Patient not taking: Reported on 10/01/2018 09/17/18   Danelle Berry, PA-C  benzonatate  (TESSALON) 100 MG capsule Take 1 capsule (100 mg total) by mouth 3 (three) times daily as needed for cough. Patient not taking: Reported on 09/17/2018 07/10/18   Danelle Berry, PA-C     Allergies  Allergen Reactions  . Beef-Derived Products   . Penicillins Other (See Comments)    Patient is unsure of reaction.     Family History  Problem Relation Age of Onset  . Arthritis Mother   . Cancer Paternal Aunt   . Early death Paternal Aunt   . Cancer Paternal Uncle   . Early death Paternal Uncle   . Hypertension Maternal Grandmother      Social History   Socioeconomic History  . Marital status: Married    Spouse name: Not on file  . Number of children: Not on file  . Years of education: Not on file  . Highest education level: Not on file  Occupational History  . Not on file  Social Needs  . Financial resource strain: Not on file  . Food insecurity:    Worry: Not on file    Inability: Not on file  . Transportation needs:    Medical: Not on file    Non-medical: Not on file  Tobacco Use  . Smoking status: Never Smoker  . Smokeless tobacco: Never Used  Substance and Sexual Activity  . Alcohol use: No  . Drug use: No  . Sexual  activity: Yes  Lifestyle  . Physical activity:    Days per week: Not on file    Minutes per session: Not on file  . Stress: Not on file  Relationships  . Social connections:    Talks on phone: Not on file    Gets together: Not on file    Attends religious service: Not on file    Active member of club or organization: Not on file    Attends meetings of clubs or organizations: Not on file    Relationship status: Not on file  . Intimate partner violence:    Fear of current or ex partner: Not on file    Emotionally abused: Not on file    Physically abused: Not on file    Forced sexual activity: Not on file  Other Topics Concern  . Not on file  Social History Narrative  . Not on file     Review of Systems  Constitutional: Negative.     HENT: Negative.   Eyes: Negative.   Respiratory: Negative.   Cardiovascular: Negative.   Gastrointestinal: Negative.   Endocrine: Negative.   Genitourinary: Negative.   Musculoskeletal: Negative.   Skin: Negative.   Allergic/Immunologic: Negative.   Neurological: Negative.   Hematological: Negative.   Psychiatric/Behavioral: Negative.   All other systems reviewed and are negative.      Objective:    Vitals:   10/01/18 0811  BP: 130/72  Pulse: 81  Resp: 15  Temp: 98.2 F (36.8 C)  TempSrc: Oral  SpO2: 96%  Weight: 198 lb 2 oz (89.9 kg)  Height: 5' 6.5" (1.689 m)      Physical Exam Vitals signs and nursing note reviewed.  Constitutional:      General: He is not in acute distress.    Appearance: Normal appearance. He is well-developed. He is not ill-appearing, toxic-appearing or diaphoretic.  HENT:     Head: Normocephalic and atraumatic.     Jaw: No trismus.     Right Ear: Tympanic membrane, ear canal and external ear normal.     Left Ear: Tympanic membrane, ear canal and external ear normal.     Nose: Mucosal edema, congestion and rhinorrhea present.     Right Sinus: No maxillary sinus tenderness or frontal sinus tenderness.     Left Sinus: No maxillary sinus tenderness or frontal sinus tenderness.     Comments: Nasal congestion, edema and erythema    Mouth/Throat:     Mouth: Mucous membranes are not pale, not dry and not cyanotic.     Pharynx: Uvula midline. Posterior oropharyngeal erythema present. No oropharyngeal exudate or uvula swelling.     Tonsils: No tonsillar exudate or tonsillar abscesses.  Eyes:     General: Lids are normal.        Right eye: No discharge.        Left eye: No discharge.     Conjunctiva/sclera: Conjunctivae normal.     Pupils: Pupils are equal, round, and reactive to light.  Neck:     Musculoskeletal: Normal range of motion and neck supple.     Thyroid: No thyromegaly.     Trachea: Trachea and phonation normal. No tracheal  deviation.  Cardiovascular:     Rate and Rhythm: Normal rate and regular rhythm.     Pulses:          Radial pulses are 2+ on the right side and 2+ on the left side.     Heart sounds: Murmur present. Systolic murmur  present. No friction rub. No gallop.   Pulmonary:     Effort: Pulmonary effort is normal. No tachypnea, accessory muscle usage or respiratory distress.     Breath sounds: Normal breath sounds. No stridor. No decreased breath sounds, wheezing, rhonchi or rales.  Abdominal:     General: Bowel sounds are normal. There is no distension.     Palpations: Abdomen is soft.     Tenderness: There is no abdominal tenderness.  Musculoskeletal: Normal range of motion.  Skin:    General: Skin is warm and dry.     Capillary Refill: Capillary refill takes less than 2 seconds.     Coloration: Skin is not pale.     Findings: No rash.     Nails: There is no clubbing.   Neurological:     Mental Status: He is alert and oriented to person, place, and time.     Motor: No abnormal muscle tone.     Coordination: Coordination normal.     Gait: Gait normal.  Psychiatric:        Speech: Speech normal.        Behavior: Behavior normal. Behavior is cooperative.           Assessment & Plan:      ICD-10-CM   1. Heart murmur R01.1 ECHOCARDIOGRAM COMPLETE  2. Subacute pansinusitis J01.40 doxycycline (VIBRA-TABS) 100 MG tablet    Pt CP resolved - suspect related to bronchitis.   He does have improved cough, no more wheeze or SOB, but he has 1 week + nasal congestion, discharge and pressure.  On exam last visit I thought I heard a faint murmur, I had given him a breathing treatment and re-listened to his heart and lungs afterwards and I did not hear it again after the breathing treatments,  Thought it might have been secondary to some resistance in the lungs.  Today listening carefully I do again hear a systolic murmur heard best in the aortic area.  I will order echo to evaluate this.  He has  no symptoms he denies any exertional shortness of breath, near syncope, orthopnea.    Danelle BerryLeisa Kamariya Blevens, PA-C 10/01/18 8:20 AM

## 2018-10-08 ENCOUNTER — Inpatient Hospital Stay (HOSPITAL_COMMUNITY): Admission: RE | Admit: 2018-10-08 | Payer: BC Managed Care – PPO | Source: Ambulatory Visit

## 2018-11-03 ENCOUNTER — Encounter: Payer: Self-pay | Admitting: Family Medicine

## 2018-11-03 ENCOUNTER — Ambulatory Visit: Payer: BC Managed Care – PPO | Admitting: Family Medicine

## 2018-11-03 VITALS — BP 112/64 | HR 98 | Temp 99.7°F | Resp 16 | Ht 66.5 in | Wt 192.5 lb

## 2018-11-03 DIAGNOSIS — M791 Myalgia, unspecified site: Secondary | ICD-10-CM

## 2018-11-03 DIAGNOSIS — R011 Cardiac murmur, unspecified: Secondary | ICD-10-CM

## 2018-11-03 DIAGNOSIS — R059 Cough, unspecified: Secondary | ICD-10-CM

## 2018-11-03 DIAGNOSIS — E669 Obesity, unspecified: Secondary | ICD-10-CM | POA: Insufficient documentation

## 2018-11-03 DIAGNOSIS — R05 Cough: Secondary | ICD-10-CM | POA: Diagnosis not present

## 2018-11-03 LAB — INFLUENZA A AND B AG, IMMUNOASSAY
INFLUENZA A ANTIGEN: NOT DETECTED
INFLUENZA B ANTIGEN: NOT DETECTED

## 2018-11-03 MED ORDER — OSELTAMIVIR PHOSPHATE 75 MG PO CAPS: 75.0000 mg | ORAL_CAPSULE | Freq: Two times a day (BID) | ORAL | 0 refills | Status: AC

## 2018-11-03 MED ORDER — ALBUTEROL SULFATE (2.5 MG/3ML) 0.083% IN NEBU
2.5000 mg | INHALATION_SOLUTION | Freq: Once | RESPIRATORY_TRACT | Status: AC
  Administered 2018-11-03: 2.5 mg via RESPIRATORY_TRACT

## 2018-11-03 MED ORDER — PREDNISONE 20 MG PO TABS: 40.0000 mg | ORAL_TABLET | Freq: Every day | ORAL | 0 refills | Status: AC

## 2018-11-03 MED ORDER — HYDROCODONE-HOMATROPINE 5-1.5 MG/5ML PO SYRP: 5.0000 mL | ORAL_SOLUTION | Freq: Three times a day (TID) | ORAL | 0 refills | Status: DC | PRN

## 2018-11-03 MED ORDER — ALBUTEROL SULFATE HFA 108 (90 BASE) MCG/ACT IN AERS: 2.0000 | INHALATION_SPRAY | RESPIRATORY_TRACT | 0 refills | Status: DC | PRN

## 2018-11-03 MED ORDER — BENZONATATE 100 MG PO CAPS: 100.0000 mg | ORAL_CAPSULE | Freq: Two times a day (BID) | ORAL | 0 refills | Status: DC | PRN

## 2018-11-03 NOTE — Progress Notes (Signed)
Patient spirometry before nebulizer treatment was 150 after 3 breaths. Post albuterol nebulizer reading patient spirometry reading was 250 after 3 breaths.

## 2018-11-03 NOTE — Progress Notes (Signed)
Patient ID: Darren Rollins, male    DOB: Apr 14, 1971, 48 y.o.   MRN: [id number]  PCP: Salley Scarlet, MD  Chief Complaint  Patient presents with  . Cough    Onset today, has pain in chest and back when coughs. Get inhale deeply    Subjective:   Darren Rollins is a 48 y.o. male, presents to clinic with CC of cough, SOB, body ache, chills sweat and subjective fever onset acute this morning upon waking.  Cough occurred throughout the night and in the morning, occurs with trying to take a normal to deep breath, mostly tight and non-productive, feels like he's breathi "sipping air through a straw" or like someone is sitting on his chest. He tried to use inhaler that he has from recent bronchitis which did present with CP x 1 month, and he was coughing so much he doesn't know if he got much of the medicine and it didn't help much.  He took tylenol with cold meds this morning - temp in clinic 99.7 No sick contacts He's concerned that he may have asthma - no hx of asthma, smoking or lung disease. He denies any associated palpitations, near syncope, N, V, D, no chest pain although it does feel tight or like his airway is restricted, he denies orthopnea, PND, LE edema, hemoptysis, unintentional weightloss, no recent travel. 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  No hx of pneumonia  With past exam a murmur was heard and ECHO was ordered to evaluate - he has not had this done yet - seems like a scheduling miscommunication    Patient Active Problem List   Diagnosis Date Noted  . Obesity 11/03/2018  . Heart murmur 11/03/2018     Prior to Admission medications   Medication Sig Start Date End Date Taking? Authorizing Provider  albuterol (PROVENTIL HFA;VENTOLIN HFA) 108 (90 Base) MCG/ACT inhaler Inhale 2 puffs into the lungs every 4 (four) hours as needed for wheezing or shortness of breath. 09/17/18  Yes Danelle Berry, PA-C      Allergies  Allergen Reactions  . Beef-Derived Products   . Penicillins Other (See Comments)    Patient is unsure of reaction.     Family History  Problem Relation Age of Onset  . Arthritis Mother   . Cancer Paternal Aunt   . Early death Paternal Aunt   . Cancer Paternal Uncle   . Early death Paternal Uncle   . Hypertension Maternal Grandmother      Social History   Socioeconomic History  . Marital status: Married    Spouse name: Not on file  . Number of children: Not on file  . Years of education: Not on file  . Highest education level: Not on file  Occupational History  . Not on file  Social Needs  . Financial resource strain: Not on file  . Food insecurity:    Worry: Not on file    Inability: Not on file  . Transportation needs:    Medical: Not on file    Non-medical: Not on file  Tobacco Use  . Smoking status: Never Smoker  . Smokeless tobacco: Never Used  Substance and Sexual Activity  . Alcohol use: No  . Drug use: No  . Sexual activity: Yes  Lifestyle  . Physical activity:    Days per week: Not on file    Minutes per session: Not on file  .  Stress: Not on file  Relationships  . Social connections:    Talks on phone: Not on file    Gets together: Not on file    Attends religious service: Not on file    Active member of club or organization: Not on file    Attends meetings of clubs or organizations: Not on file    Relationship status: Not on file  . Intimate partner violence:    Fear of current or ex partner: Not on file    Emotionally abused: Not on file    Physically abused: Not on file    Forced sexual activity: Not on file  Other Topics Concern  . Not on file  Social History Narrative  . Not on file     Review of Systems  Constitutional: Negative.   HENT: Negative.   Eyes: Negative.   Respiratory: Negative.   Cardiovascular: Negative.   Gastrointestinal: Negative.   Endocrine: Negative.   Genitourinary: Negative.    Musculoskeletal: Negative.   Skin: Negative.   Allergic/Immunologic: Negative.   Neurological: Negative.   Hematological: Negative.   Psychiatric/Behavioral: Negative.   All other systems reviewed and are negative.      Objective:    Vitals:   11/03/18 1414  BP: 112/64  Pulse: 95  Resp: 16  Temp: 99.7 F (37.6 C)  TempSrc: Oral  SpO2: 97%  Weight: 192 lb 8 oz (87.3 kg)  Height: 5' 6.5" (1.689 m)      Physical Exam Vitals signs and nursing note reviewed.  Constitutional:      General: He is not in acute distress.    Appearance: Normal appearance. He is well-developed. He is not ill-appearing, toxic-appearing or diaphoretic.  HENT:     Head: Normocephalic and atraumatic.     Jaw: No trismus.     Right Ear: Tympanic membrane, ear canal and external ear normal.     Left Ear: Tympanic membrane, ear canal and external ear normal.     Nose: Nose normal. No mucosal edema, congestion or rhinorrhea.     Right Sinus: No maxillary sinus tenderness or frontal sinus tenderness.     Left Sinus: No maxillary sinus tenderness or frontal sinus tenderness.     Mouth/Throat:     Lips: Pink.     Mouth: Mucous membranes are moist. Mucous membranes are not pale, not dry and not cyanotic.     Pharynx: Uvula midline. Posterior oropharyngeal erythema present. No pharyngeal swelling, oropharyngeal exudate or uvula swelling.     Tonsils: No tonsillar exudate or tonsillar abscesses.  Eyes:     General: Lids are normal.        Right eye: No discharge.        Left eye: No discharge.     Conjunctiva/sclera: Conjunctivae normal.     Pupils: Pupils are equal, round, and reactive to light.  Neck:     Musculoskeletal: Full passive range of motion without pain, normal range of motion and neck supple.     Trachea: Trachea and phonation normal. No tracheal deviation.  Cardiovascular:     Rate and Rhythm: Normal rate and regular rhythm.     Chest Wall: PMI is not displaced.     Pulses: Normal  pulses.          Radial pulses are 2+ on the right side and 2+ on the left side.     Heart sounds: Murmur present. Systolic murmur present. No friction rub. No gallop.   Pulmonary:  Effort: Pulmonary effort is normal. No tachypnea, accessory muscle usage, prolonged expiration, respiratory distress or retractions.     Breath sounds: No stridor. No wheezing, rhonchi or rales.     Comments: Difficulty with inspiratory effort (poor with splinting), diminished BS b/l mid to lower lung fields, frequent coughing, BS coarse throughout Chest:     Chest wall: No tenderness.  Abdominal:     General: Bowel sounds are normal. There is no distension.     Palpations: Abdomen is soft.     Tenderness: There is no abdominal tenderness.  Musculoskeletal: Normal range of motion.     Right lower leg: No edema.     Left lower leg: No edema.  Lymphadenopathy:     Cervical: No cervical adenopathy.  Skin:    General: Skin is warm and dry.     Capillary Refill: Capillary refill takes less than 2 seconds.     Coloration: Skin is not cyanotic, mottled or pale.     Findings: No rash.     Nails: There is no clubbing.   Neurological:     Mental Status: He is alert and oriented to person, place, and time.     Motor: No abnormal muscle tone.     Coordination: Coordination normal.     Gait: Gait normal.  Psychiatric:        Speech: Speech normal.        Behavior: Behavior normal. Behavior is cooperative.           Assessment & Plan:        ICD-10-CM   1. Muscle ache M79.10 Influenza A and B Ag, Immunoassay   viral illness - acute onset, fever, body ache, respiratory sx - suspect flu - start tx for it  2. Cough R05 Influenza A and B Ag, Immunoassay    albuterol (PROVENTIL) (2.5 MG/3ML) 0.083% nebulizer solution 2.5 mg    DG Chest 2 View   tx for acute bronchitis, pt to get CXR if not improving, treating flu - decrease peak flow here, did improve with bronchodilator  3. Heart murmur R01.1    still  needs to go get ECHO - message sent to try and reschedule    Pt with acute onset of sx today, diffuse body aches nonproductive cough shortness of breath fever sweats chills, he is afebrile here but did take Tylenol prior to coming, vital signs stable no tachycardia no hypoxia or tachypnea.  Initial exam breath sounds very difficult to hear patient has a lot of difficulty taking a good breath in, given a breathing treatment also had pre-and post spirometry readings please see nursing notes.  They are both low, but he did improve with bronchodilator.  He was reexamined after breathing treatment, breath sounds did improve somewhat but still slightly coarse and diminished throughout however there was no focal area of crackles and there was no wheeze.  His flu test here were negative but his symptoms to started this morning clinically suspicious for it with recent respiratory illness I am going to treat him with Tamiflu.  If is not improving is a chest x-ray ordered. Close follow up in 1 to 2 weeks Reviewed ER precautions -I had recently seen the patient for concerns of chest pain, that we describe it does have very cardiac in nature we did work this up and his EKG was unremarkable he improved with treatment for bronchitis his pain completely resolved.  Discussed with him cardiac symptoms respiratory symptoms that warrant going to the  ER for further evaluation this included but was not limited to chest pain or pressure associated with any near syncope clammy diaphoresis nausea vomiting, palpitations.  Any respiratory symptoms that worsens or shortness of breath, breathing rapidly and no improvement with inhaler.  He verbalized understanding of ER precautions and follow up plan.    If he continues to have respiratory illness/SOB/recurrent bronchitis - may need to work it up with PFT's or further cardiac work up   [employer], PA-C 11/03/18 2:20 PM

## 2018-11-04 ENCOUNTER — Encounter: Payer: Self-pay | Admitting: Family Medicine

## 2018-11-17 ENCOUNTER — Ambulatory Visit (HOSPITAL_COMMUNITY): Payer: BC Managed Care – PPO | Attending: Family Medicine

## 2018-11-30 ENCOUNTER — Other Ambulatory Visit: Payer: Self-pay | Admitting: Family Medicine

## 2019-10-18 ENCOUNTER — Ambulatory Visit
Admission: RE | Admit: 2019-10-18 | Discharge: 2019-10-18 | Disposition: A | Discharge status: Home / Self Care | Payer: BC Managed Care – PPO | Source: Ambulatory Visit | Attending: Family Medicine | Admitting: Family Medicine

## 2019-10-18 ENCOUNTER — Other Ambulatory Visit: Payer: Self-pay

## 2019-10-18 ENCOUNTER — Encounter: Payer: Self-pay | Admitting: Family Medicine

## 2019-10-18 ENCOUNTER — Ambulatory Visit: Payer: BC Managed Care – PPO | Admitting: Family Medicine

## 2019-10-18 VITALS — BP 118/70 | HR 80 | Temp 98.7°F | Resp 14 | Ht 66.5 in | Wt 198.0 lb

## 2019-10-18 DIAGNOSIS — M542 Cervicalgia: Secondary | ICD-10-CM | POA: Diagnosis not present

## 2019-10-18 DIAGNOSIS — H9313 Tinnitus, bilateral: Secondary | ICD-10-CM

## 2019-10-18 DIAGNOSIS — M5412 Radiculopathy, cervical region: Secondary | ICD-10-CM | POA: Diagnosis not present

## 2019-10-18 DIAGNOSIS — M5030 Other cervical disc degeneration, unspecified cervical region: Secondary | ICD-10-CM

## 2019-10-18 MED ORDER — PREDNISONE 10 MG PO TABS: ORAL_TABLET | ORAL | 0 refills | Status: DC

## 2019-10-18 MED ORDER — ALBUTEROL SULFATE HFA 108 (90 BASE) MCG/ACT IN AERS: 2.0000 | INHALATION_SPRAY | RESPIRATORY_TRACT | 1 refills | Status: DC | PRN

## 2019-10-18 NOTE — Patient Instructions (Addendum)
Schedule physical in next 2- 3 months Xray to be done-  [address] [employer] [address] We will call about MRI  Take the anti-histamine every day for at least 2-3 weeks Take the steroids

## 2019-10-18 NOTE — Progress Notes (Addendum)
   Subjective:    Patient ID: Darren Rollins, male    DOB: 22-Aug-1971, 49 y.o.   MRN: [id number]  Patient presents for Tinnitis (x months- ringing in B ears- no pain) and Hand Numbness (B hands go to sleep at night, neck is stiff )  Ringing in both ears for the past 8 months.He does get sinus pressure and drainage. He viral symptoms in Jan, which may have been COVID in retrospect, but he was treated with tamiflu, steroids, zpak at that time.  HE does take claritin for regular allergies  He does notice some changes in his hearing.  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 During the day does not have pain, but whne he is sitting or sleeping he has pain. His hands have been going numb for years now, but now associated with the shoulder pain. 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  He will get episodes if hands are dangling to long          Review Of Systems:  GEN- denies fatigue, fever, weight loss,weakness, recent illness HEENT- denies eye drainage, change in vision, nasal discharge, CVS- denies chest pain, palpitations RESP- denies SOB, cough, wheeze ABD- denies N/V, change in stools, abd pain GU- denies dysuria, hematuria, dribbling, incontinence MSK- + joint pain, muscle aches, injury Neuro- denies headache, dizziness, syncope, seizure activity       Objective:    BP 118/70   Pulse 80   Temp 98.7 F (37.1 C) (Temporal)   Resp 14   Ht 5' 6.5" (1.689 m)   Wt 198 lb (89.8 kg)   SpO2 97%   BMI 31.48 kg/m  GEN- NAD, alert and oriented x3 HEENT- PERRL, EOMI, non injected sclera, pink conjunctiva, MMM, oropharynx clear, TM clear bilat, no effusion, nares clear, hearing grossly in tat Neck- Supple,  no thyromegaly, fair ROM, pain with rotation to left, + spurlings, left CVS- RRR, no murmur RESP-CTAB MSK- FROM Upper ext, rotator cuff in tact Neuro- decreased sensation monofilament left hand/forearm compared to Right, decreased gross sensation, normal tone  strength 5/5 bilat, neg tinels, neg phalens EXT- No edema Pulses- Radial  2+        Assessment & Plan:      Problem List Items Addressed This Visit    None    Visit Diagnoses    Neck pain    -  Primary   Relevant Orders   DG Cervical Spine Complete (Completed)   Cervical radiculopathy       Concern for nerve impingement possible spinal stenosis, Xray obtained shows DDD, he has chronic nerve damage, obtain MRI, steroids given for pain and the tinnitus, can also use anti-histime for the tinnitus  Pending results ortho referral    Relevant Orders   DG Cervical Spine Complete (Completed)   Tinnitus of both ears          Note: This dictation was prepared with Dragon dictation along with smaller phrase technology. Any transcriptional errors that result from this process are unintentional.

## 2019-10-19 ENCOUNTER — Encounter: Payer: Self-pay | Admitting: Family Medicine

## 2019-11-05 ENCOUNTER — Other Ambulatory Visit: Payer: Self-pay | Admitting: Family Medicine

## 2019-11-05 DIAGNOSIS — M5030 Other cervical disc degeneration, unspecified cervical region: Secondary | ICD-10-CM

## 2019-11-05 DIAGNOSIS — M5412 Radiculopathy, cervical region: Secondary | ICD-10-CM

## 2019-11-05 NOTE — Progress Notes (Signed)
MRI denied, will send to orthopedics

## 2020-03-14 ENCOUNTER — Encounter: Payer: Self-pay | Admitting: Emergency Medicine

## 2020-03-14 ENCOUNTER — Ambulatory Visit
Admission: EM | Admit: 2020-03-14 | Discharge: 2020-03-14 | Disposition: A | Discharge status: Home / Self Care | Payer: BC Managed Care – PPO | Attending: Emergency Medicine | Admitting: Emergency Medicine

## 2020-03-14 ENCOUNTER — Other Ambulatory Visit: Payer: Self-pay

## 2020-03-14 DIAGNOSIS — S0501XA Injury of conjunctiva and corneal abrasion without foreign body, right eye, initial encounter: Secondary | ICD-10-CM

## 2020-03-14 MED ORDER — OFLOXACIN 0.3 % OP SOLN: 1.0000 [drp] | Freq: Four times a day (QID) | OPHTHALMIC | 0 refills | Status: DC

## 2020-03-14 NOTE — ED Triage Notes (Signed)
Redness to RT outside corner of eye that started this morning. Pt reports pain and tearing.  Denies any injury.

## 2020-03-14 NOTE — ED Provider Notes (Signed)
[facility]   [id number] 03/14/20 Arrival Time: 1458  CC: Red eye  SUBJECTIVE:  Darren Rollins is a 49 y.o. male who presents with complaint of right eye redness that started this morning.  Denies a precipitating event, trauma, or close contacts with similar symptoms.  Has tried OTC eye drops without relief.  Symptoms are made worse with opening the eye.  Denies similar symptoms in the past.  Denies fever, chills, nausea, vomiting, eye pain, painful eye movements, halos, itching, vision changes, double vision, FB sensation, periorbital erythema.     Denies contact lens use.    ROS: As per HPI.  All other pertinent ROS negative.     Past Medical History:  Diagnosis Date   Allergy    seasonal   Past Surgical History:  Procedure Laterality Date   BACK SURGERY     Allergies  Allergen Reactions   Beef-Derived Products    Penicillins Other (See Comments)    Patient is unsure of reaction.   No current facility-administered medications on file prior to encounter.   Current Outpatient Medications on File Prior to Encounter  Medication Sig Dispense Refill   albuterol (VENTOLIN HFA) 108 (90 Base) MCG/ACT inhaler Inhale 2 puffs into the lungs every 4 (four) hours as needed for wheezing or shortness of breath. 18 g 1   predniSONE (DELTASONE) 10 MG tablet Take 40mg  x 2 days,20mg  x 2 days, 10mg  x 2 days 14 tablet 0   Social History   Socioeconomic History   Marital status: Married    Spouse name: Not on file   Number of children: Not on file   Years of education: Not on file   Highest education level: Not on file  Occupational History   Not on file  Tobacco Use   Smoking status: Never Smoker   Smokeless tobacco: Never Used  Substance and Sexual Activity   Alcohol use: No   Drug use: No   Sexual activity: Yes  Other Topics Concern   Not on file  Social History Narrative   Not on file   Social Determinants of Health   Financial Resource Strain:      Difficulty of Paying Living Expenses:   Food Insecurity:    Worried About in the Last Year:    in the Last Year:   Transportation Needs:    Programme researcher, broadcasting/film/video (Medical):    Lack of Transportation (Non-Medical):   Physical Activity:    Days of Exercise per Week:    Minutes of Exercise per Session:   Stress:    Feeling of Stress :   Social Connections:    Frequency of Communication with Friends and Family:    Frequency of Social Gatherings with Friends and Family:    Attends Religious Services:    Active Member of Clubs or Organizations:    Attends Barista:    Marital Status:   Intimate Partner Violence:    Fear of Current or Ex-Partner:    Emotionally Abused:    Physically Abused:    Sexually Abused:    Family History  Problem Relation Age of Onset   Arthritis Mother    Cancer Paternal Aunt    Early death Paternal Aunt    Cancer Paternal Uncle    Early death Paternal Uncle    Hypertension Maternal Grandmother     OBJECTIVE:    Visual Acuity  Right Eye Distance:   Left Eye  Distance:   Bilateral Distance:    Right Eye Near:   Left Eye Near:    Bilateral Near:      Vitals:   03/14/20 1519  Weight: 185 lb (83.9 kg)  Height: 5\' 7"  (1.702 m)    General appearance: alert; no distress Eyes: No conjunctival erythema. PERRL; EOMI without discomfort;  no obvious drainage; lid everted without obvious FB; no obvious fluorescein uptake  Neck: supple Lungs: clear to auscultation bilaterally Heart: regular rate and rhythm Skin: warm and dry Psychological: alert and cooperative; normal mood and affect   ASSESSMENT & PLAN:  1. Abrasion of right cornea, initial encounter     Meds ordered this encounter  Medications   ofloxacin (OCUFLOX) 0.3 % ophthalmic solution    Sig: Place 1 drop into the right eye 4 (four) times daily.    Dispense:  5 mL    Refill:  0  .  Discharge  instructions Use ofloxacin as prescribed and to completion Use OTC systane or genteal gel eye drops at night as needed for symptomatic relief Use OTC ibuprofen or tylenol as needed for pain relief Return here or follow up with ophthamolgy if symptoms persists or worsen such as fever, chills, redness, swelling, eye pain, painful eye movements, vision changes, etc...   Reviewed expectations re: course of current medical issues. Questions answered. Outlined signs and symptoms indicating need for more acute intervention. Patient verbalized understanding. After Visit Summary given.   Emerson Monte, FNP 03/14/20 [phone]

## 2020-03-14 NOTE — Discharge Instructions (Signed)
Use ofloxacin as prescribed and to completion Use OTC systane or genteal gel eye drops at night as needed for symptomatic relief Use OTC ibuprofen or tylenol as needed for pain relief Return here or follow up with ophthamolgy if symptoms persists or worsen such as fever, chills, redness, swelling, eye pain, painful eye movements, vision changes, etc..... 

## 2020-07-28 ENCOUNTER — Ambulatory Visit: Payer: BC Managed Care – PPO | Admitting: Family Medicine

## 2021-01-08 ENCOUNTER — Ambulatory Visit: Payer: BC Managed Care – PPO | Admitting: Internal Medicine

## 2021-09-08 IMAGING — CR DG CERVICAL SPINE COMPLETE 4+V
5 series · 5 of 5 positions shown · non-contrast
Comparison: None.

CLINICAL DATA: 48-year-old male with a history of neck pain

EXAM:
CERVICAL SPINE - COMPLETE 4+ VIEW

[w cervical spine lat]
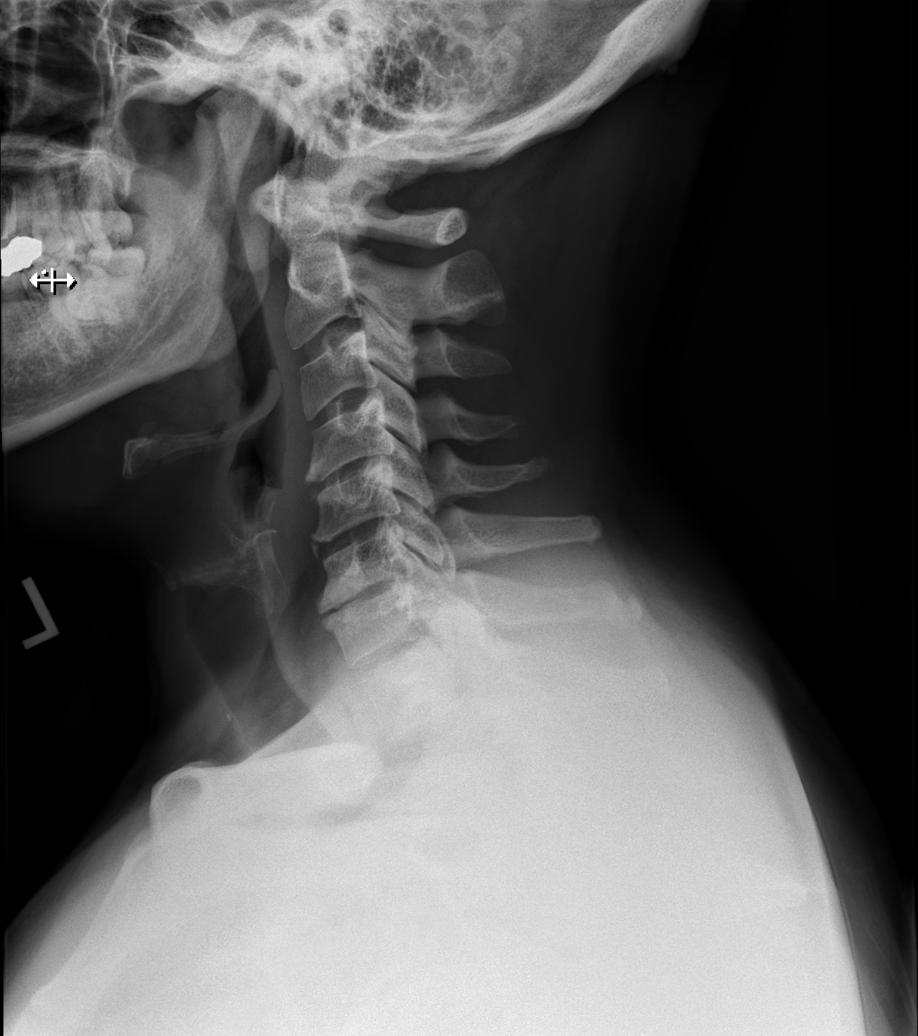

[w cervical spine ap_obl (1 of 2)]
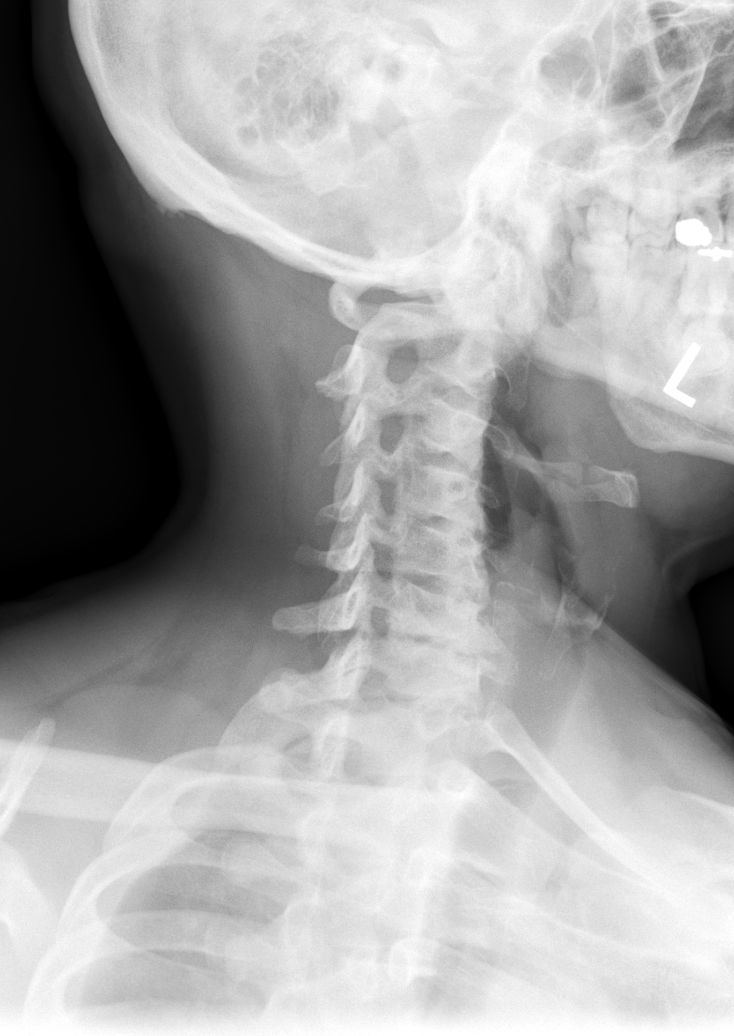

[w cervical spine ap_obl (2 of 2)]
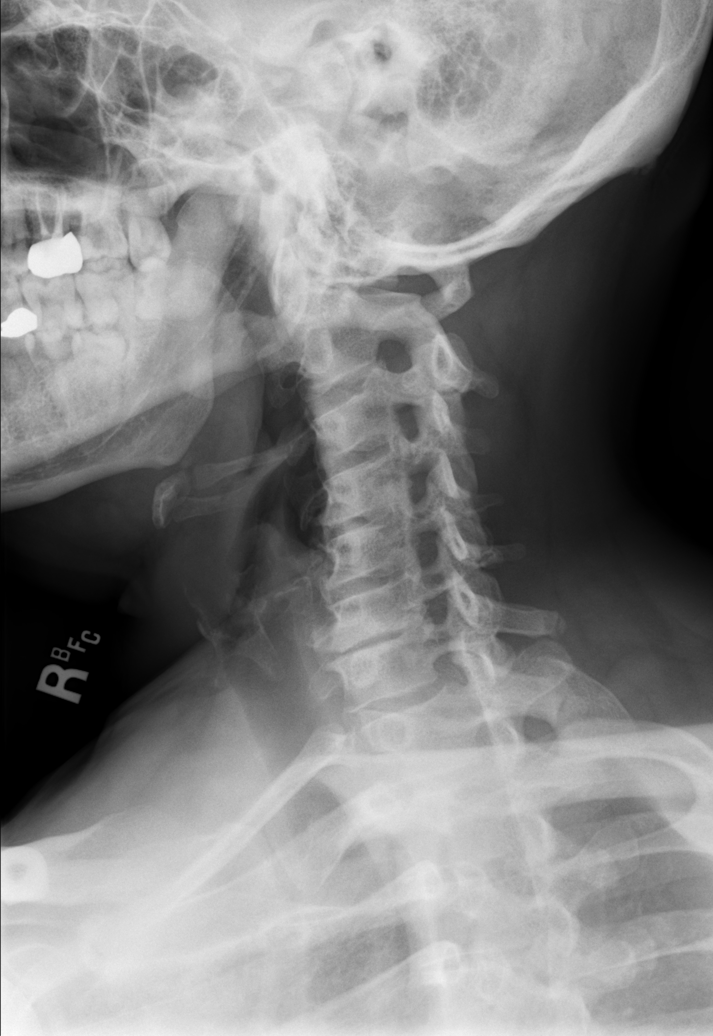

[w cervical spine ap]
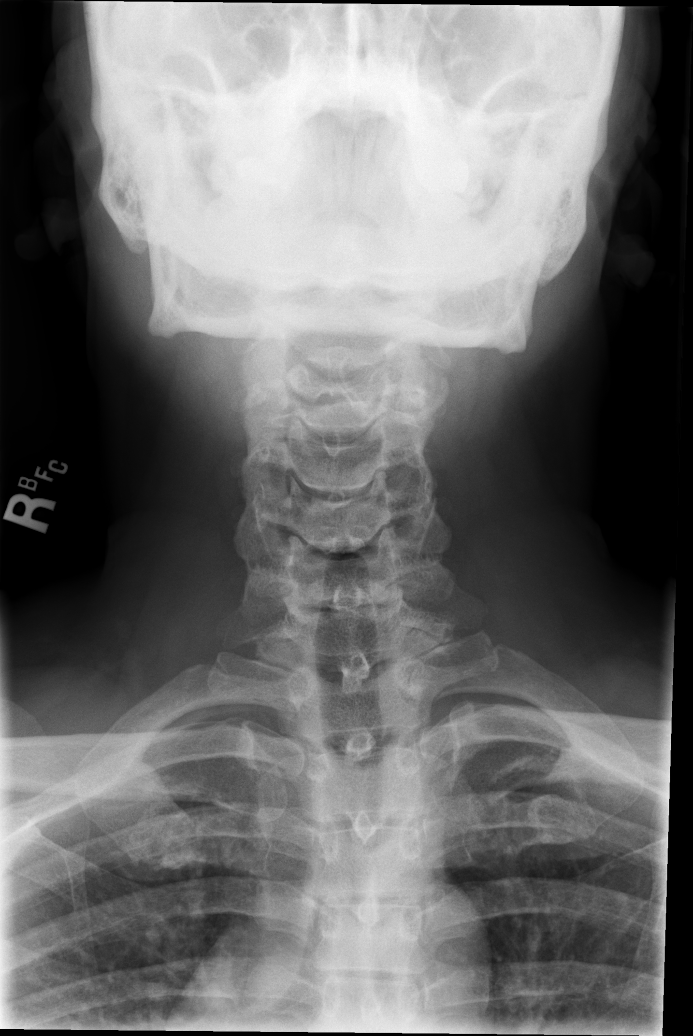

[w cervical spine odontoid]
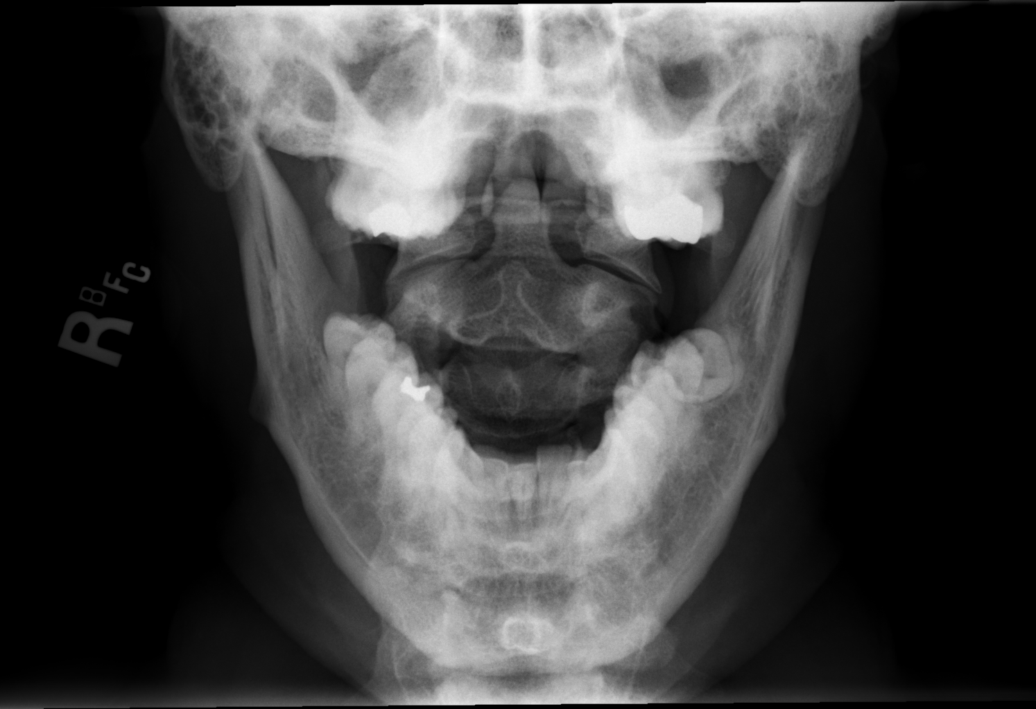

[5 of 5 positions shown; findings below may reference images not displayed]

FINDINGS: Cervical Spine:

Cervical elements maintain relative anatomic alignment from the
level of C1-T1. Unremarkable appearance of the craniocervical
junction.

No subluxation, anterolisthesis, retrolisthesis.

No acute fracture line identified.

Vertebral body heights maintained.

Mild disc space narrowing of C4-C5 with early anterior osteophyte
formation and uncovertebral joint disease.

Moderate disc space narrowing of C6-C7 with endplate sclerosis
anterior osteophyte production, and uncovertebral joint disease.

Right oblique images demonstrate no significant foraminal
encroachment.

Left oblique images demonstrate no significant foraminal
encroachment.

No significant facet disease.

Prevertebral soft tissues within normal limits.
IMPRESSION: Negative for acute fracture or malalignment of the cervical spine.

Degenerative disc disease is moderate at C6-C7, and mild at C4-C5 as
above.

## 2021-09-21 ENCOUNTER — Other Ambulatory Visit: Payer: Self-pay

## 2021-09-21 ENCOUNTER — Encounter: Payer: Self-pay | Admitting: Emergency Medicine

## 2021-09-21 ENCOUNTER — Ambulatory Visit
Admission: EM | Admit: 2021-09-21 | Discharge: 2021-09-21 | Disposition: A | Discharge status: Home / Self Care | Payer: BC Managed Care – PPO | Attending: Family Medicine | Admitting: Family Medicine

## 2021-09-21 DIAGNOSIS — R509 Fever, unspecified: Secondary | ICD-10-CM

## 2021-09-21 DIAGNOSIS — Z1152 Encounter for screening for COVID-19: Secondary | ICD-10-CM

## 2021-09-21 DIAGNOSIS — J069 Acute upper respiratory infection, unspecified: Secondary | ICD-10-CM | POA: Diagnosis not present

## 2021-09-21 DIAGNOSIS — R000 Tachycardia, unspecified: Secondary | ICD-10-CM

## 2021-09-21 DIAGNOSIS — J4521 Mild intermittent asthma with (acute) exacerbation: Secondary | ICD-10-CM | POA: Diagnosis not present

## 2021-09-21 MED ORDER — ALBUTEROL SULFATE HFA 108 (90 BASE) MCG/ACT IN AERS: 2.0000 | INHALATION_SPRAY | RESPIRATORY_TRACT | 0 refills | Status: AC | PRN

## 2021-09-21 MED ORDER — PROMETHAZINE-DM 6.25-15 MG/5ML PO SYRP: 5.0000 mL | ORAL_SOLUTION | Freq: Four times a day (QID) | ORAL | 0 refills | Status: DC | PRN

## 2021-09-21 MED ORDER — PREDNISONE 20 MG PO TABS: 40.0000 mg | ORAL_TABLET | Freq: Every day | ORAL | 0 refills | Status: DC

## 2021-09-21 MED ORDER — OSELTAMIVIR PHOSPHATE 75 MG PO CAPS: 75.0000 mg | ORAL_CAPSULE | Freq: Two times a day (BID) | ORAL | 0 refills | Status: DC

## 2021-09-21 MED ORDER — ACETAMINOPHEN 325 MG PO TABS
650.0000 mg | ORAL_TABLET | Freq: Once | ORAL | Status: AC
  Administered 2021-09-21: 650 mg via ORAL

## 2021-09-21 NOTE — ED Triage Notes (Addendum)
Patient c/o  SOB and chest congestion x 1 day.   Patient endorses chest tightness with worsening while coughing.   Patient endorses productive cough. Patient endorses generalized body aches.   Patient has taken OTC Delsym with no relief of symptoms.

## 2021-09-21 NOTE — ED Provider Notes (Signed)
[facility]    CSN: [id number] Arrival date & time: 09/21/21  0814      History   Chief Complaint Chief Complaint  Patient presents with   Shortness of Breath   Chest Congestion    HPI Darren Rollins is a 50 y.o. male.   Presenting today with 1 day history of fever, body aches, fatigue, productive cough, chest tightness, shortness of breath.  Denies chest pain, sore throat, abdominal pain, nausea vomiting or diarrhea.  So far taking Delsym and Aleve with minimal relief.  No known sick contacts recently.  Denies any known history of asthma or COPD, smoking but states he does have an old inhaler from previous illness where he had similar breathing issues.   Past Medical History:  Diagnosis Date   Allergy    seasonal    Patient Active Problem List   Diagnosis Date Noted   Obesity 11/03/2018   Heart murmur 11/03/2018    Past Surgical History:  Procedure Laterality Date   BACK SURGERY         Home Medications    Prior to Admission medications   Medication Sig Start Date End Date Taking? Authorizing Provider  oseltamivir (TAMIFLU) 75 MG capsule Take 1 capsule (75 mg total) by mouth every 12 (twelve) hours. 09/21/21  Yes Particia Nearing, PA-C  predniSONE (DELTASONE) 20 MG tablet Take 2 tablets (40 mg total) by mouth daily with breakfast. 09/21/21  Yes Particia Nearing, PA-C  promethazine-dextromethorphan (PROMETHAZINE-DM) 6.25-15 MG/5ML syrup Take 5 mLs by mouth 4 (four) times daily as needed. 09/21/21  Yes Particia Nearing, PA-C  albuterol (VENTOLIN HFA) 108 (90 Base) MCG/ACT inhaler Inhale 2 puffs into the lungs every 4 (four) hours as needed for wheezing or shortness of breath. 09/21/21   Particia Nearing, PA-C  ofloxacin (OCUFLOX) 0.3 % ophthalmic solution Place 1 drop into the right eye 4 (four) times daily. 03/14/20   Avegno, Zachery Dakins, FNP  predniSONE (DELTASONE) 10 MG tablet Take 40mg  x 2 days,20mg  x 2 days, 10mg  x 2 days 10/18/19    , MD    Family History Family History  Problem Relation Age of Onset   Arthritis Mother    Cancer Paternal Aunt    Early death Paternal Aunt    Cancer Paternal Uncle    Early death Paternal Uncle    Hypertension Maternal Grandmother     Social History Social History   Tobacco Use   Smoking status: Never   Smokeless tobacco: Never  Substance Use Topics   Alcohol use: No   Drug use: No     Allergies   Beef-derived products and Penicillins   Review of Systems Review of Systems Per HPI  Physical Exam Triage Vital Signs ED Triage Vitals [09/21/21 0843]  Enc Vitals Group     BP (!) 153/84     Pulse Rate (!) 104     Resp 20     Temp (!) 103 F (39.4 C)     Temp Source Oral     SpO2 93 %     Weight      Height      Head Circumference      Peak Flow      Pain Score 5     Pain Loc      Pain Edu?      Excl. in GC?    No data found.  Updated Vital Signs BP (!) 153/84 (BP Location: Right Arm)  Pulse (!) 104   Temp (!) 103 F (39.4 C) (Oral)   Resp 20   SpO2 93%   Visual Acuity Right Eye Distance:   Left Eye Distance:   Bilateral Distance:    Right Eye Near:   Left Eye Near:    Bilateral Near:     Physical Exam Vitals and nursing note reviewed.  Constitutional:      Appearance: He is well-developed.  HENT:     Head: Atraumatic.     Right Ear: External ear normal.     Left Ear: External ear normal.     Nose: Rhinorrhea present.     Mouth/Throat:     Pharynx: Posterior oropharyngeal erythema present. No oropharyngeal exudate.  Eyes:     Conjunctiva/sclera: Conjunctivae normal.     Pupils: Pupils are equal, round, and reactive to light.  Cardiovascular:     Rate and Rhythm: Normal rate and regular rhythm.  Pulmonary:     Effort: Pulmonary effort is normal. No respiratory distress.     Breath sounds: Wheezing present. No rales.  Musculoskeletal:        General: Normal range of motion.     Cervical back: Normal range of  motion and neck supple.  Lymphadenopathy:     Cervical: No cervical adenopathy.  Skin:    General: Skin is warm and dry.  Neurological:     Mental Status: He is alert and oriented to person, place, and time.  Psychiatric:        Behavior: Behavior normal.     UC Treatments / Results  Labs (all labs ordered are listed, but only abnormal results are displayed) Labs Reviewed  COVID-19, FLU A+B NAA    EKG   Radiology No results found.  Procedures Procedures (including critical care time)  Medications Ordered in UC Medications  acetaminophen (TYLENOL) tablet 650 mg (650 mg Oral Given 09/21/21 0857)    Initial Impression / Assessment and Plan / UC Course  I have reviewed the triage vital signs and the nursing notes.  Pertinent labs & imaging results that were available during my care of the patient were reviewed by me and considered in my medical decision making (see chart for details).     Significantly febrile and mildly tachycardic in triage, Tylenol given at this time.  Strong suspicion for influenza given symptoms, and likely some underlying reactive airway exacerbation so will treat with prednisone, Phenergan DM, albuterol inhaler and start Tamiflu proactively while awaiting COVID and flu results.  Discussed supportive care and return precautions.  Final Clinical Impressions(s) / UC Diagnoses   Final diagnoses:  Viral URI with cough  Fever, unspecified  Tachycardia  Mild intermittent reactive airway disease with acute exacerbation   Discharge Instructions   None    ED Prescriptions     Medication Sig Dispense Auth. Provider   albuterol (VENTOLIN HFA) 108 (90 Base) MCG/ACT inhaler Inhale 2 puffs into the lungs every 4 (four) hours as needed for wheezing or shortness of breath. 18 g Roosvelt Maser [place], [place]   promethazine-dextromethorphan (PROMETHAZINE-DM) 6.25-15 MG/5ML syrup Take 5 mLs by mouth 4 (four) times daily as needed. 100 mL Particia Nearing,  [place]   oseltamivir (TAMIFLU) 75 MG capsule Take 1 capsule (75 mg total) by mouth every 12 (twelve) hours. 10 capsule Particia Nearing, PA-C   predniSONE (DELTASONE) 20 MG tablet Take 2 tablets (40 mg total) by mouth daily with breakfast. 10 tablet Particia Nearing, [place]  PDMP not reviewed this encounter.   Particia Nearing, [place] 09/21/21 1024

## 2021-09-22 LAB — COVID-19, FLU A+B NAA
Influenza A, NAA: NOT DETECTED
Influenza B, NAA: NOT DETECTED
SARS-CoV-2, NAA: DETECTED — AB

## 2022-06-20 ENCOUNTER — Encounter: Payer: Self-pay | Admitting: Internal Medicine

## 2022-06-20 ENCOUNTER — Ambulatory Visit: Payer: BC Managed Care – PPO | Admitting: Internal Medicine

## 2022-06-20 VITALS — BP 124/80 | HR 77 | Ht 67.0 in | Wt 189.4 lb

## 2022-06-20 DIAGNOSIS — Z2821 Immunization not carried out because of patient refusal: Secondary | ICD-10-CM

## 2022-06-20 DIAGNOSIS — Z0001 Encounter for general adult medical examination with abnormal findings: Secondary | ICD-10-CM

## 2022-06-20 DIAGNOSIS — M5030 Other cervical disc degeneration, unspecified cervical region: Secondary | ICD-10-CM | POA: Diagnosis not present

## 2022-06-20 DIAGNOSIS — Z0000 Encounter for general adult medical examination without abnormal findings: Secondary | ICD-10-CM | POA: Diagnosis not present

## 2022-06-20 NOTE — Progress Notes (Signed)
New Patient Office Visit  Subjective    Patient ID: Darren Rollins, male    DOB: [birth year]  Age: 51 y.o. MRN: [id number]  CC:  Chief Complaint  Patient presents with   Establish Care   HPI Darren Rollins presents to establish care.  He is a 51 year old male with no significant past medical history aside from known degenerative disc disease of his cervical spine.  He was previously followed by Dr. Jeanice Rollins at [facility] family [facility].  Last seen in January 2021.  Mr. Darren Rollins states that he is doing well overall today.  He does however endorse chronic bilateral shoulder and neck pain.  Mr. Darren Rollins describes bilateral shoulder pain along the superior and lateral aspects of the shoulders.  His left shoulder is more bothersome than the right.  He experiences numbness/tingling sensations into both of his arms and hands.  The symptoms in his right shoulder and arm are more noticeable at night.  Per chart review, x-rays of the cervical spine were obtained in January 2021 and demonstrated degenerative disc disease at C6-7 and C4-5.  There is no evidence of acute fracture or malalignment.  According to Mr. Darren Rollins, he was referred to [facility] and underwent an MRI of his cervical spine.  He is unaware of the results and did not follow-up with the ear practice.  He states that his symptoms have not worsened, but persist.  Mr. Darren Rollins denies additional symptoms or acute concerns currently.  Outpatient Encounter Medications as of 06/20/2022  Medication Sig   albuterol (VENTOLIN HFA) 108 (90 Base) MCG/ACT inhaler Inhale 2 puffs into the lungs every 4 (four) hours as needed for wheezing or shortness of breath.   [DISCONTINUED] ofloxacin (OCUFLOX) 0.3 % ophthalmic solution Place 1 drop into the right eye 4 (four) times daily.   [DISCONTINUED] oseltamivir (TAMIFLU) 75 MG capsule Take 1 capsule (75 mg total) by mouth every 12 (twelve) hours.   [DISCONTINUED] predniSONE (DELTASONE) 10 MG tablet Take  40mg  x 2 days,20mg  x 2 days, 10mg  x 2 days   [DISCONTINUED] predniSONE (DELTASONE) 20 MG tablet Take 2 tablets (40 mg total) by mouth daily with breakfast.   [DISCONTINUED] promethazine-dextromethorphan (PROMETHAZINE-DM) 6.25-15 MG/5ML syrup Take 5 mLs by mouth 4 (four) times daily as needed.   No facility-administered encounter medications on file as of 06/20/2022.    Past Medical History:  Diagnosis Date   Allergy    seasonal    Past Surgical History:  Procedure Laterality Date   BACK SURGERY      Family History  Problem Relation Age of Onset   Arthritis Mother    Cancer Paternal Aunt    Early death Paternal Aunt    Cancer Paternal Uncle    Early death Paternal Uncle    Hypertension Maternal Grandmother     Social History   Socioeconomic History   Marital status: Married    Spouse name: Not on file   Number of children: Not on file   Years of education: Not on file   Highest education level: Not on file  Occupational History   Not on file  Tobacco Use   Smoking status: Never   Smokeless tobacco: Never  Substance and Sexual Activity   Alcohol use: No   Drug use: No   Sexual activity: Yes  Other Topics Concern   Not on file  Social History Narrative   Not on file   Social Determinants of Health   Financial Resource Strain: Not  on file  Food Insecurity: Not on file  Transportation Needs: Not on file  Physical Activity: Not on file  Stress: Not on file  Social Connections: Not on file  Intimate Partner Violence: Not on file   Review of Systems  Constitutional:  Negative for chills and fever.  HENT:  Negative for sore throat.   Respiratory:  Negative for cough and shortness of breath.   Cardiovascular:  Negative for chest pain, palpitations and leg swelling.  Gastrointestinal:  Negative for abdominal pain, blood in stool, constipation, diarrhea, nausea and vomiting.  Genitourinary:  Negative for dysuria and hematuria.  Musculoskeletal:  Positive for neck  pain. Negative for myalgias.       Chronic shoulder / neck pain  Skin:  Negative for itching and rash.  Neurological:  Negative for dizziness and headaches.  Psychiatric/Behavioral:  Negative for depression and suicidal ideas.     Objective    BP 124/80   Pulse 77   Ht 5\' 7"  (1.702 m)   Wt 189 lb 6.4 oz (85.9 kg)   SpO2 97%   BMI 29.66 kg/m   Physical Exam Vitals reviewed.  Constitutional:      General: He is not in acute distress.    Appearance: Normal appearance. He is not ill-appearing.  HENT:     Head: Normocephalic and atraumatic.     Nose: Nose normal. No congestion or rhinorrhea.     Mouth/Throat:     Mouth: Mucous membranes are moist.     Pharynx: Oropharynx is clear.  Eyes:     Extraocular Movements: Extraocular movements intact.     Conjunctiva/sclera: Conjunctivae normal.     Pupils: Pupils are equal, round, and reactive to light.  Cardiovascular:     Rate and Rhythm: Normal rate and regular rhythm.     Pulses: Normal pulses.     Heart sounds: Murmur heard.  Pulmonary:     Effort: Pulmonary effort is normal.     Breath sounds: Normal breath sounds. No wheezing, rhonchi or rales.  Abdominal:     General: Abdomen is flat. Bowel sounds are normal. There is no distension.     Palpations: Abdomen is soft.     Tenderness: There is no abdominal tenderness.  Musculoskeletal:        General: No swelling or deformity.     Cervical back: Normal range of motion.     Comments: ROM of both shoulders is generally intact.  No pain elicited with resisted abduction or IR/ER.  Strength 5/5 in bilateral upper extremities.  ROM of the neck is reduced due to pain.  In particular, he has pain with lateral movements in both directions.  Negative Spurling's.  Skin:    General: Skin is warm and dry.     Capillary Refill: Capillary refill takes less than 2 seconds.  Neurological:     General: No focal deficit present.     Mental Status: He is alert and oriented to person, place,  and time.     Motor: No weakness.  Psychiatric:        Mood and Affect: Mood normal.        Behavior: Behavior normal.        Thought Content: Thought content normal.    Last CBC Lab Results  Component Value Date   WBC 4.9 09/17/2018   HGB 16.5 09/17/2018   HCT 48.2 09/17/2018   MCV 89.9 09/17/2018   MCH 30.8 09/17/2018   RDW 12.6 09/17/2018   PLT  196 09/17/2018   Last metabolic panel Lab Results  Component Value Date   GLUCOSE 84 09/17/2018   NA 139 09/17/2018   K 4.3 09/17/2018   CL 105 09/17/2018   CO2 26 09/17/2018   BUN 16 09/17/2018   CREATININE 1.15 09/17/2018   GFRNONAA 76 09/17/2018   CALCIUM 9.1 09/17/2018   PROT 6.9 09/17/2018   ALBUMIN 4.2 11/19/2016   BILITOT 0.4 09/17/2018   ALKPHOS 49 11/19/2016   AST 19 09/17/2018   ALT 30 09/17/2018   Last lipids Lab Results  Component Value Date   CHOL 174 09/17/2018   HDL 38 (L) 09/17/2018   LDLCALC 111 (H) 09/17/2018   TRIG 134 09/17/2018   CHOLHDL 4.6 09/17/2018   Last hemoglobin A1c No results found for: "HGBA1C" Last thyroid functions Lab Results  Component Value Date   TSH 1.45 09/17/2018        Assessment & Plan:   Problem List Items Addressed This Visit       Musculoskeletal and Integument   Degenerative disc disease, cervical    Presents today to establish care and endorses chronic neck and bilateral shoulder pain.  He has prior imaging from 2021 that demonstrates multilevel degenerative disc disease of the cervical spine.  States that he was referred to [facility] for evaluation and underwent an MRI.  I do not have access to those records.  On exam today the range of motion in his neck is reduced due to pain.  Negative Spurling's.  Strength grossly intact in bilateral upper extremities. -I provided him with home PT exercises for both his neck and shoulders. -He has signed a record release form for [place] to obtain MRI results from [facility].  Pending results, he may need to reestablish  care with orthopedic surgery/sports medicine.        Other   Preventative health care    Establishing care today -Basic labs ordered, including one-time screenings for both HIV/HCV -He is due for colon cancer screening and is agreeable to Cologuard -Declined influenza/COVID vaccinations today      Return in about 1 year (around 06/21/2023).   Billie Lade, MD

## 2022-06-20 NOTE — Assessment & Plan Note (Signed)
Presents today to establish care and endorses chronic neck and bilateral shoulder pain.  He has prior imaging from 2021 that demonstrates multilevel degenerative disc disease of the cervical spine.  States that he was referred to [facility] for evaluation and underwent an MRI.  I do not have access to those records.  On exam today the range of motion in his neck is reduced due to pain.  Negative Spurling's.  Strength grossly intact in bilateral upper extremities. -I provided him with home PT exercises for both his neck and shoulders. -He has signed a record release form for [place] to obtain MRI results from [facility].  Pending results, he may need to reestablish care with orthopedic surgery/sports medicine.

## 2022-06-20 NOTE — Assessment & Plan Note (Signed)
Establishing care today -Basic labs ordered, including one-time screenings for both HIV/HCV -He is due for colon cancer screening and is agreeable to Cologuard -Declined influenza/COVID vaccinations today

## 2022-06-20 NOTE — Patient Instructions (Signed)
It was a pleasure to see you today.  Thank you for giving [place] the opportunity to be involved in your care.  Below is a brief recap of your visit and next steps.  We will plan to see you again in 1 year.  Summary Thank you for choosing to establish care at our practice today. We have ordered basic lab work and cologuard for colorectal cancer screening  Next steps Follow up in 1 year I will notify you of lab results tomorrow

## 2022-06-21 LAB — CBC WITH DIFFERENTIAL/PLATELET
Basophils Absolute: 0.1 10*3/uL (ref 0.0–0.2)
Basos: 2 %
EOS (ABSOLUTE): 0.2 10*3/uL (ref 0.0–0.4)
Eos: 3 %
Hematocrit: 49 % (ref 37.5–51.0)
Hemoglobin: 16.6 g/dL (ref 13.0–17.7)
Immature Grans (Abs): 0 10*3/uL (ref 0.0–0.1)
Immature Granulocytes: 0 %
Lymphocytes Absolute: 2.1 10*3/uL (ref 0.7–3.1)
Lymphs: 48 %
MCH: 31.3 pg (ref 26.6–33.0)
MCHC: 33.9 g/dL (ref 31.5–35.7)
MCV: 92 fL (ref 79–97)
Monocytes Absolute: 0.4 10*3/uL (ref 0.1–0.9)
Monocytes: 9 %
Neutrophils Absolute: 1.7 10*3/uL (ref 1.4–7.0)
Neutrophils: 38 %
Platelets: 178 10*3/uL (ref 150–450)
RBC: 5.31 x10E6/uL (ref 4.14–5.80)
RDW: 12.9 % (ref 11.6–15.4)
WBC: 4.5 10*3/uL (ref 3.4–10.8)

## 2022-06-21 LAB — CMP14+EGFR
ALT: 26 IU/L (ref 0–44)
AST: 19 IU/L (ref 0–40)
Albumin/Globulin Ratio: 1.6 (ref 1.2–2.2)
Albumin: 4.2 g/dL (ref 4.1–5.1)
Alkaline Phosphatase: 64 IU/L (ref 44–121)
BUN/Creatinine Ratio: 11 (ref 9–20)
BUN: 14 mg/dL (ref 6–24)
Bilirubin Total: 0.4 mg/dL (ref 0.0–1.2)
CO2: 23 mmol/L (ref 20–29)
Calcium: 9.3 mg/dL (ref 8.7–10.2)
Chloride: 105 mmol/L (ref 96–106)
Creatinine, Ser: 1.24 mg/dL (ref 0.76–1.27)
Globulin, Total: 2.7 g/dL (ref 1.5–4.5)
Glucose: 78 mg/dL (ref 70–99)
Potassium: 4.3 mmol/L (ref 3.5–5.2)
Sodium: 142 mmol/L (ref 134–144)
Total Protein: 6.9 g/dL (ref 6.0–8.5)
eGFR: 71 mL/min/{1.73_m2} (ref >59)

## 2022-06-21 LAB — HCV INTERPRETATION

## 2022-06-21 LAB — HEMOGLOBIN A1C
Est. average glucose Bld gHb Est-mCnc: 114 mg/dL
Hgb A1c MFr Bld: 5.6 % (ref 4.8–5.6)

## 2022-06-21 LAB — LIPID PANEL
Chol/HDL Ratio: 4.9 ratio (ref 0.0–5.0)
Cholesterol, Total: 162 mg/dL (ref 100–199)
HDL: 33 mg/dL — ABNORMAL LOW (ref >39)
LDL Chol Calc (NIH): 91 mg/dL (ref 0–99)
Triglycerides: 221 mg/dL — ABNORMAL HIGH (ref 0–149)
VLDL Cholesterol Cal: 38 mg/dL (ref 5–40)

## 2022-06-21 LAB — HCV AB W REFLEX TO QUANT PCR: HCV Ab: NONREACTIVE

## 2022-06-21 LAB — B12 AND FOLATE PANEL
Folate: 12.2 ng/mL (ref >3.0)
Vitamin B-12: 368 pg/mL (ref 232–1245)

## 2022-06-21 LAB — HIV ANTIBODY (ROUTINE TESTING W REFLEX): HIV Screen 4th Generation wRfx: NONREACTIVE

## 2023-02-13 ENCOUNTER — Telehealth: Payer: Self-pay | Admitting: Internal Medicine

## 2023-02-13 NOTE — Telephone Encounter (Signed)
Patient called has not yet heard from provider on his MRI results.

## 2023-02-14 NOTE — Telephone Encounter (Signed)
Called pt and no answer.  

## 2023-02-17 NOTE — Telephone Encounter (Signed)
Call pt and no answer.

## 2023-02-17 NOTE — Telephone Encounter (Signed)
LVM

## 2023-02-17 NOTE — Telephone Encounter (Signed)
Spoke to patient he said the MRI was ordered by old provider no longer sees this provider. Can dr Durwin Nora go over the MRI. Patient call back # [phone].  Per patient dr Durwin Nora pulled up the MRI , this does patient need an appt.?

## 2023-02-17 NOTE — Telephone Encounter (Signed)
If patient calls back please advise him he will need to follow up with the office who ordered the MRI.

## 2023-02-17 NOTE — Telephone Encounter (Signed)
Spoke with patient.

## 2023-02-26 ENCOUNTER — Ambulatory Visit: Payer: BC Managed Care – PPO | Admitting: Internal Medicine

## 2023-02-26 ENCOUNTER — Encounter: Payer: Self-pay | Admitting: Internal Medicine

## 2023-02-26 VITALS — BP 123/71 | HR 91 | Ht 66.0 in | Wt 190.0 lb

## 2023-02-26 DIAGNOSIS — M4802 Spinal stenosis, cervical region: Secondary | ICD-10-CM

## 2023-02-26 DIAGNOSIS — M5030 Other cervical disc degeneration, unspecified cervical region: Secondary | ICD-10-CM

## 2023-02-26 NOTE — Progress Notes (Signed)
Acute Office Visit  Subjective:     Patient ID: Darren Rollins, male    DOB: 1971/01/06, 52 y.o.   MRN: [id number]  Chief Complaint  Patient presents with   Referral    Shoulder, neck, back   Mr. Kuczek presents today for an acute visit endorsing neck and bilateral shoulder pain.  Last evaluated by me on 06/20/22 at which time his acute concern was persistent neck and bilateral shoulder pain.  Known history of degenerative disc disease of the cervical spine.  He was provided with home PT exercises.  MRI results requested from [facility].  Today he describes persistent pain in his neck, mostly along the left trapezius.  He additionally states that if he holds his arms up for extended periods of time his hands began to go numb.  He denies weakness in his arms.  Cervical spine MRI from 2021 reviewed.  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  Review of Systems  Musculoskeletal:  Positive for joint pain (Bilateral shoulder pain) and neck pain.  All other systems reviewed and are negative.     Objective:    BP 123/71   Pulse 91   Ht 5\' 6"  (1.676 m)   Wt 190 lb (86.2 kg)   SpO2 95%   BMI 30.67 kg/m   Physical Exam Vitals reviewed.  Constitutional:      General: He is not in acute distress.    Appearance: Normal appearance. He is not ill-appearing.  HENT:     Head: Normocephalic and atraumatic.     Right Ear: External ear normal.     Left Ear: External ear normal.     Nose: Nose normal. No congestion or rhinorrhea.     Mouth/Throat:     Mouth: Mucous membranes are moist.     Pharynx: Oropharynx is clear.  Eyes:     General: No scleral icterus.    Extraocular Movements: Extraocular movements intact.     Conjunctiva/sclera: Conjunctivae normal.     Pupils: Pupils are equal, round, and reactive to light.  Cardiovascular:     Rate and Rhythm: Normal rate and regular rhythm.     Pulses: Normal pulses.      Heart sounds: Normal heart sounds. No murmur heard. Pulmonary:     Effort: Pulmonary effort is normal.     Breath sounds: Normal breath sounds. No wheezing, rhonchi or rales.  Abdominal:     General: Abdomen is flat. Bowel sounds are normal. There is no distension.     Palpations: Abdomen is soft.     Tenderness: There is no abdominal tenderness.  Musculoskeletal:        General: Tenderness (TTP over the left trapezius) present. No swelling or deformity.     Cervical back: Normal range of motion.     Comments: Left lateral movements of the neck are reduced.  ROM is otherwise intact.  Bilateral shoulder ROM is intact.  No weakness or sensation discrepancy is appreciated bilaterally.  Skin:    General: Skin is warm and dry.     Capillary Refill: Capillary refill takes less than 2 seconds.  Neurological:     General: No focal deficit present.     Mental Status: He is alert and oriented to person, place, and time.     Motor: No weakness.  Psychiatric:        Mood and Affect: Mood normal.  Behavior: Behavior normal.        Thought Content: Thought content normal.       Assessment & Plan:   Problem List Items Addressed This Visit       Degenerative disc disease, cervical    Presenting today for an acute visit to discuss persistent neck and bilateral shoulder pain.  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  Symptoms are progressively worsening.  He states that if he holds his arms up for extended periods of time his hands began to get numb. -Neurosurgery referral placed today for further evaluation and management      Relevant Orders   Ambulatory referral to Neurosurgery   Return if symptoms worsen or fail to improve.  Billie Lade, MD

## 2023-02-26 NOTE — Assessment & Plan Note (Signed)
Presenting today for an acute visit to discuss persistent neck and bilateral shoulder pain.  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  Symptoms are progressively worsening.  He states that if he holds his arms up for extended periods of time his hands began to get numb. -Neurosurgery referral placed today for further evaluation and management

## 2023-02-26 NOTE — Patient Instructions (Signed)
It was a pleasure to see you today.  Thank you for giving [place] the opportunity to be involved in your care.  Below is a brief recap of your visit and next steps.  We will plan to see you again in September.  Summary Neurosurgery referral placed today Follow up as previously scheduled in September

## 2023-03-24 ENCOUNTER — Ambulatory Visit: Payer: BC Managed Care – PPO | Admitting: Orthopedic Surgery

## 2023-06-23 ENCOUNTER — Encounter: Payer: BC Managed Care – PPO | Admitting: Internal Medicine

## 2023-07-01 ENCOUNTER — Encounter: Payer: BC Managed Care – PPO | Admitting: Internal Medicine

## 2023-07-02 ENCOUNTER — Encounter: Payer: Self-pay | Admitting: Internal Medicine
